# Patient Record
Sex: Female | Born: 1990
Health system: Southern US, Community
[De-identification: ages and names within clinical notes are randomized; demographics above are authoritative.]

## PROBLEM LIST (undated history)

## (undated) ENCOUNTER — Inpatient Hospital Stay (HOSPITAL_COMMUNITY): Payer: Self-pay

## (undated) DIAGNOSIS — K219 Gastro-esophageal reflux disease without esophagitis: Secondary | ICD-10-CM

## (undated) DIAGNOSIS — J45909 Unspecified asthma, uncomplicated: Secondary | ICD-10-CM

## (undated) DIAGNOSIS — K602 Anal fissure, unspecified: Secondary | ICD-10-CM

## (undated) DIAGNOSIS — A549 Gonococcal infection, unspecified: Secondary | ICD-10-CM

## (undated) DIAGNOSIS — B999 Unspecified infectious disease: Secondary | ICD-10-CM

## (undated) DIAGNOSIS — A749 Chlamydial infection, unspecified: Secondary | ICD-10-CM

## (undated) HISTORY — PX: KNEE SURGERY: SHX244

---
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
## 2011-01-10 ENCOUNTER — Ambulatory Visit (INDEPENDENT_AMBULATORY_CARE_PROVIDER_SITE_OTHER): Payer: Self-pay

## 2011-01-10 ENCOUNTER — Inpatient Hospital Stay (INDEPENDENT_AMBULATORY_CARE_PROVIDER_SITE_OTHER)
Admission: RE | Admit: 2011-01-10 | Discharge: 2011-01-10 | Disposition: A | Discharge status: Home / Self Care | Payer: Self-pay | Source: Ambulatory Visit | Attending: Family Medicine | Admitting: Family Medicine

## 2011-01-10 DIAGNOSIS — S93409A Sprain of unspecified ligament of unspecified ankle, initial encounter: Secondary | ICD-10-CM

## 2011-02-12 NOTE — Op Note (Signed)
Lisa Bond, Lisa Bond                ACCOUNT NO.:  [id number]   MEDICAL RECORD NO.:  [id number]          PATIENT TYPE:  AMB   LOCATION:  [facility]                         FACILITY:  [facility]   PHYSICIAN:  Almedia Balls. Ranell Patrick, M.D. DATE OF BIRTH:  June 26, 1991   DATE OF PROCEDURE:  12/21/2005  DATE OF DISCHARGE:                                 OPERATIVE REPORT   PREOPERATIVE DIAGNOSIS:  Left knee foreign body.   POSTOPERATIVE DIAGNOSIS:  Left knee foreign body.   OPERATION PERFORMED:  Excision of foreign body, left prepatellar area  (knee).   SURGEON:  Almedia Balls. Ranell Patrick, M.D.   ASSISTANT:  None.   ANESTHESIA:  Local plus block.   ESTIMATED BLOOD LOSS:  Minimal.   TOURNIQUET TIME:  12 minutes.   INSTRUMENT COUNT:  Correct.   COMPLICATIONS:  None.  Perioperative antibiotics were given.   INDICATIONS FOR PROCEDURE:  The patient is a 20 year old female with a  history of questionable foreign body in the left prepatellar area.  She has  a very tender spot there and has actually had it break open and drain a  couple of times.  X-rays negative. Patient presents now for presumptive  foreign body excision.   DESCRIPTION OF PROCEDURE:  After adequate level of anesthesia achieved,  patient positioned supine on the operating table.  Nonsterile tourniquet was  placed on the left proximal thigh.  The left leg was sterilely prepped and  draped in the usual manner.  We exsanguinated the limb using Esmarch  bandage, elevated the tourniquet to 275 mmHg.  A football shaped skin  incision was created around the premarked area on the prepatellar left knee.  Dissected down through the subcutaneous tissues all the way down to the  bursal area in the capsule and took this out as a single full thickness  excisional biopsy and sent this to pathology.  Went ahead and thoroughly  irrigated the wound.  Bovie electrocautery for hemostasis, then closed with  a layered closure of Vicryl, Monocryl, Steri-Strips applied  followed by  sterile dressing.  The patient was taken to the recovery room having  tolerated surgery well.           ______________________________  Almedia Balls. Ranell Patrick, M.D.    SRN/MEDQ  D:  12/21/2005  T:  12/23/2005  Job:  [id number]

## 2011-04-29 ENCOUNTER — Emergency Department (HOSPITAL_COMMUNITY)
Admission: EM | Admit: 2011-04-29 | Discharge: 2011-04-29 | Disposition: A | Discharge status: Home / Self Care | Payer: Self-pay | Attending: Emergency Medicine | Admitting: Emergency Medicine

## 2011-04-29 DIAGNOSIS — N898 Other specified noninflammatory disorders of vagina: Secondary | ICD-10-CM | POA: Insufficient documentation

## 2011-04-29 DIAGNOSIS — N946 Dysmenorrhea, unspecified: Secondary | ICD-10-CM | POA: Insufficient documentation

## 2011-04-29 DIAGNOSIS — N949 Unspecified condition associated with female genital organs and menstrual cycle: Secondary | ICD-10-CM | POA: Insufficient documentation

## 2011-04-29 DIAGNOSIS — R109 Unspecified abdominal pain: Secondary | ICD-10-CM | POA: Insufficient documentation

## 2011-04-29 DIAGNOSIS — R3915 Urgency of urination: Secondary | ICD-10-CM | POA: Insufficient documentation

## 2011-04-29 DIAGNOSIS — N938 Other specified abnormal uterine and vaginal bleeding: Secondary | ICD-10-CM | POA: Insufficient documentation

## 2011-04-29 DIAGNOSIS — R110 Nausea: Secondary | ICD-10-CM | POA: Insufficient documentation

## 2011-04-29 LAB — WET PREP, GENITAL
Clue Cells Wet Prep HPF POC: NONE SEEN
Trich, Wet Prep: NONE SEEN
Yeast Wet Prep HPF POC: NONE SEEN

## 2011-04-29 LAB — URINALYSIS, ROUTINE W REFLEX MICROSCOPIC
Glucose, UA: NEGATIVE mg/dL
Ketones, ur: 15 mg/dL — AB
Nitrite: NEGATIVE
Protein, ur: 30 mg/dL — AB
Specific Gravity, Urine: 1.029 (ref 1.005–1.030)
Urobilinogen, UA: 1 mg/dL (ref 0.0–1.0)
pH: 6.5 (ref 5.0–8.0)

## 2011-04-29 LAB — POCT PREGNANCY, URINE: Preg Test, Ur: NEGATIVE

## 2011-04-29 LAB — URINE MICROSCOPIC-ADD ON

## 2011-05-03 NOTE — ED Notes (Signed)
Prescriptions called in for cipro 500mg  po x1 dose and azithromycin one gm po x1 dose at [facility] on [address], no refills; instructed pt in safe sex, informing all partners, and no sex until 10 days after last partner treated

## 2011-07-09 LAB — POCT URINALYSIS DIP (DEVICE)
Hgb urine dipstick: NEGATIVE
Ketones, ur: NEGATIVE
Protein, ur: NEGATIVE
Specific Gravity, Urine: 1.015
pH: 6.5

## 2011-07-13 LAB — ANA: Anti Nuclear Antibody(ANA): NEGATIVE

## 2011-07-13 LAB — RHEUMATOID FACTOR: Rhuematoid fact SerPl-aCnc: <20

## 2011-07-13 LAB — SEDIMENTATION RATE: Sed Rate: 3

## 2012-04-18 ENCOUNTER — Emergency Department (HOSPITAL_COMMUNITY)
Admission: EM | Admit: 2012-04-18 | Discharge: 2012-04-18 | Disposition: A | Discharge status: Home / Self Care | Payer: Self-pay | Attending: Emergency Medicine | Admitting: Emergency Medicine

## 2012-04-18 ENCOUNTER — Encounter (HOSPITAL_COMMUNITY): Payer: Self-pay | Admitting: *Deleted

## 2012-04-18 DIAGNOSIS — N760 Acute vaginitis: Secondary | ICD-10-CM | POA: Insufficient documentation

## 2012-04-18 DIAGNOSIS — F17200 Nicotine dependence, unspecified, uncomplicated: Secondary | ICD-10-CM | POA: Insufficient documentation

## 2012-04-18 DIAGNOSIS — R109 Unspecified abdominal pain: Secondary | ICD-10-CM

## 2012-04-18 DIAGNOSIS — A499 Bacterial infection, unspecified: Secondary | ICD-10-CM | POA: Insufficient documentation

## 2012-04-18 DIAGNOSIS — B9689 Other specified bacterial agents as the cause of diseases classified elsewhere: Secondary | ICD-10-CM | POA: Insufficient documentation

## 2012-04-18 LAB — POCT I-STAT, CHEM 8
Calcium, Ion: 1.19 mmol/L (ref 1.12–1.23)
Creatinine, Ser: 0.8 mg/dL (ref 0.50–1.10)
Hemoglobin: 14.3 g/dL (ref 12.0–15.0)
Sodium: 141 mEq/L (ref 135–145)
TCO2: 23 mmol/L (ref 0–100)

## 2012-04-18 LAB — URINALYSIS, ROUTINE W REFLEX MICROSCOPIC
Ketones, ur: NEGATIVE mg/dL
Protein, ur: NEGATIVE mg/dL
Urobilinogen, UA: 1 mg/dL (ref 0.0–1.0)

## 2012-04-18 LAB — WET PREP, GENITAL
Trich, Wet Prep: NONE SEEN
Yeast Wet Prep HPF POC: NONE SEEN

## 2012-04-18 LAB — URINE MICROSCOPIC-ADD ON

## 2012-04-18 MED ORDER — METRONIDAZOLE 500 MG PO TABS: 500.0000 mg | ORAL_TABLET | Freq: Two times a day (BID) | ORAL | Status: AC

## 2012-04-18 NOTE — ED Notes (Signed)
Pt is here with fatigue.  Pt is here with lower abdominal pain and back pain for the last 2weeks.  PT has missed menstral this month.  Pt thinks 2-3 weeks late..  No vaginal discharge or bleeding and reports increased urination.  Pt reports thirst

## 2012-04-18 NOTE — ED Provider Notes (Signed)
History     CSN: [id number]  Arrival date & time 04/18/12  1041   First MD Initiated Contact with Patient 04/18/12 1159      Chief Complaint  Patient presents with  . Abdominal Pain    (Consider location/radiation/quality/duration/timing/severity/associated sxs/prior treatment) HPI Comments: Patient reports that she has had lower abdominal pain intermittently over the past 2 weeks.  Pain predominantly in the suprapubic area.  She has not taken anything for pain.  She describes the pain as a cramping type pain.  She also reports that her menstrual cycle is approximately 2-3 weeks late.  She normally has regular periods.  She is currently sexually active.  She denies any vaginal discharge or vaginal bleeding.    Patient is a 21 y.o. female presenting with abdominal pain. The history is provided by the patient.  Abdominal Pain The primary symptoms of the illness include abdominal pain. The primary symptoms of the illness do not include fever, shortness of breath, nausea, vomiting, diarrhea, dysuria, vaginal discharge or vaginal bleeding.  The patient states that she believes she is currently not pregnant. The patient has not had a change in bowel habit. Symptoms associated with the illness do not include chills, constipation, urgency, hematuria or frequency.    History reviewed. No pertinent past medical history.  Past Surgical History  Procedure Date  . Knee surgery     left    No family history on file.  History  Substance Use Topics  . Smoking status: Current Everyday Smoker  . Smokeless tobacco: Not on file  . Alcohol Use: No    OB History    Grav Para Term Preterm Abortions TAB SAB Ect Mult Living                  Review of Systems  Constitutional: Negative for fever and chills.  Respiratory: Negative for shortness of breath.   Cardiovascular: Negative for chest pain.  Gastrointestinal: Positive for abdominal pain. Negative for nausea, vomiting, diarrhea,  constipation, blood in stool and abdominal distention.  Genitourinary: Positive for pelvic pain. Negative for dysuria, urgency, frequency, hematuria, vaginal bleeding, vaginal discharge, genital sores and vaginal pain.  Neurological: Negative for dizziness, syncope and light-headedness.    Allergies  Amoxicillin  Home Medications   Current Outpatient Rx  Name Route Sig Dispense Refill  . METRONIDAZOLE 500 MG PO TABS Oral Take 1 tablet (500 mg total) by mouth 2 (two) times daily. 14 tablet 0    BP 112/77  Pulse 78  Temp 98.2 F (36.8 C) (Oral)  Resp 18  SpO2 98%  LMP 03/11/2012  Physical Exam  Nursing note and vitals reviewed. Constitutional: She appears well-developed and well-nourished. No distress.  HENT:  Head: Normocephalic and atraumatic.  Mouth/Throat: Oropharynx is clear and moist.  Neck: Normal range of motion. Neck supple.  Cardiovascular: Normal rate, regular rhythm and normal heart sounds.   Pulmonary/Chest: Effort normal and breath sounds normal.  Abdominal: Soft. Bowel sounds are normal. She exhibits no distension and no mass. There is tenderness in the suprapubic area. There is no rebound and no guarding.       Mild suprapubic abdominal pain. No RLQ or LLQ pain.  Genitourinary: Uterus normal. There is no rash, tenderness or lesion on the right labia. There is no rash, tenderness or lesion on the left labia. Cervix exhibits no motion tenderness. Right adnexum displays no mass, no tenderness and no fullness. Left adnexum displays no mass, no tenderness and no fullness. No erythema,  tenderness or bleeding around the vagina. Vaginal discharge found.       Thick white discharge in the vaginal vault  Neurological: She is alert.  Skin: Skin is warm and dry. No rash noted. She is not diaphoretic.  Psychiatric: She has a normal mood and affect.    ED Course  Procedures (including critical care time)  Labs Reviewed  URINALYSIS, ROUTINE W REFLEX MICROSCOPIC -  Abnormal; Notable for the following:    Leukocytes, UA TRACE (*)     All other components within normal limits  POCT I-STAT, CHEM 8 - Abnormal; Notable for the following:    Glucose, Bld 105 (*)     All other components within normal limits  URINE MICROSCOPIC-ADD ON - Abnormal; Notable for the following:    Squamous Epithelial / LPF FEW (*)     Bacteria, UA FEW (*)     All other components within normal limits  WET PREP, GENITAL - Abnormal; Notable for the following:    Clue Cells Wet Prep HPF POC FEW (*)     WBC, Wet Prep HPF POC MODERATE (*)     All other components within normal limits  POCT PREGNANCY, URINE  GC/CHLAMYDIA PROBE AMP, GENITAL   No results found.   1. Abdominal pain   2. Bacterial vaginosis       MDM  Patient here with intermittent lower abdominal pain that has been present for the past 2 weeks.  Urine pregnancy negative.  No nausea, vomiting, or diarrhea.  No vaginal bleeding.  Patient afebrile.  Labs unremarkable.  No rebound or guarding on exam.  Therefore, do not feel that any imaging is indicated at this time.          Pascal Lux [employer], PA-C 04/19/12 [phone]

## 2012-04-19 LAB — GC/CHLAMYDIA PROBE AMP, GENITAL: GC Probe Amp, Genital: NEGATIVE

## 2012-04-19 NOTE — ED Provider Notes (Signed)
Medical screening examination/treatment/procedure(s) were performed by non-physician practitioner and as supervising physician I was immediately available for consultation/collaboration.  Cheri Guppy, MD 04/19/12 [phone]

## 2012-08-31 ENCOUNTER — Emergency Department (HOSPITAL_COMMUNITY)
Admission: EM | Admit: 2012-08-31 | Discharge: 2012-08-31 | Disposition: A | Discharge status: Home / Self Care | Payer: Self-pay | Attending: Emergency Medicine | Admitting: Emergency Medicine

## 2012-08-31 ENCOUNTER — Encounter (HOSPITAL_COMMUNITY): Payer: Self-pay | Admitting: Emergency Medicine

## 2012-08-31 DIAGNOSIS — F17200 Nicotine dependence, unspecified, uncomplicated: Secondary | ICD-10-CM | POA: Insufficient documentation

## 2012-08-31 DIAGNOSIS — R102 Pelvic and perineal pain: Secondary | ICD-10-CM

## 2012-08-31 DIAGNOSIS — R109 Unspecified abdominal pain: Secondary | ICD-10-CM | POA: Insufficient documentation

## 2012-08-31 DIAGNOSIS — N946 Dysmenorrhea, unspecified: Secondary | ICD-10-CM | POA: Insufficient documentation

## 2012-08-31 DIAGNOSIS — R3915 Urgency of urination: Secondary | ICD-10-CM | POA: Insufficient documentation

## 2012-08-31 DIAGNOSIS — R110 Nausea: Secondary | ICD-10-CM | POA: Insufficient documentation

## 2012-08-31 DIAGNOSIS — R350 Frequency of micturition: Secondary | ICD-10-CM | POA: Insufficient documentation

## 2012-08-31 DIAGNOSIS — Z3202 Encounter for pregnancy test, result negative: Secondary | ICD-10-CM | POA: Insufficient documentation

## 2012-08-31 DIAGNOSIS — N949 Unspecified condition associated with female genital organs and menstrual cycle: Secondary | ICD-10-CM | POA: Insufficient documentation

## 2012-08-31 DIAGNOSIS — M549 Dorsalgia, unspecified: Secondary | ICD-10-CM | POA: Insufficient documentation

## 2012-08-31 LAB — URINALYSIS, ROUTINE W REFLEX MICROSCOPIC
Bilirubin Urine: NEGATIVE
Glucose, UA: NEGATIVE mg/dL
Ketones, ur: NEGATIVE mg/dL
Protein, ur: NEGATIVE mg/dL
Urobilinogen, UA: 1 mg/dL (ref 0.0–1.0)

## 2012-08-31 LAB — URINE MICROSCOPIC-ADD ON

## 2012-08-31 LAB — POCT PREGNANCY, URINE: Preg Test, Ur: NEGATIVE

## 2012-08-31 MED ORDER — ONDANSETRON 4 MG PO TBDP
4.0000 mg | ORAL_TABLET | Freq: Once | ORAL | Status: AC
  Administered 2012-08-31: 4 mg via ORAL
  Filled 2012-08-31: qty 1

## 2012-08-31 MED ORDER — OXYCODONE-ACETAMINOPHEN 5-325 MG PO TABS
1.0000 | ORAL_TABLET | Freq: Once | ORAL | Status: AC
  Administered 2012-08-31: 1 via ORAL

## 2012-08-31 MED ORDER — OXYCODONE-ACETAMINOPHEN 5-325 MG PO TABS
1.0000 | ORAL_TABLET | Freq: Once | ORAL | Status: DC
  Filled 2012-08-31 (×2): qty 1

## 2012-08-31 MED ORDER — CEFTRIAXONE SODIUM 250 MG IJ SOLR
250.0000 mg | Freq: Once | INTRAMUSCULAR | Status: AC
  Administered 2012-08-31: 250 mg via INTRAMUSCULAR
  Filled 2012-08-31: qty 250

## 2012-08-31 MED ORDER — AZITHROMYCIN 250 MG PO TABS
1000.0000 mg | ORAL_TABLET | Freq: Once | ORAL | Status: AC
  Administered 2012-08-31: 1000 mg via ORAL
  Filled 2012-08-31: qty 3
  Filled 2012-08-31: qty 4

## 2012-08-31 NOTE — ED Notes (Signed)
Pt c/o generalized abd pain and lower back pain with pain and frequency with urination x several days

## 2012-08-31 NOTE — ED Provider Notes (Addendum)
History     CSN: [id number]  Arrival date & time 08/31/12  1147   First MD Initiated Contact with Patient 08/31/12 1613      Chief Complaint  Patient presents with  . Dysuria  . Abdominal Pain  . Back Pain    (Consider location/radiation/quality/duration/timing/severity/associated sxs/prior treatment) Patient is a 21 y.o. female presenting with dysuria, abdominal pain, and back pain. The history is provided by the patient.  Dysuria  This is a new problem. Episode onset: 2 weeks. The problem occurs every urination. The problem has been gradually worsening. The quality of the pain is described as burning. The pain is at a severity of 7/10. The pain is moderate. There has been no fever. She is sexually active. There is no history of pyelonephritis. Associated symptoms include nausea, frequency, urgency and flank pain. Pertinent negatives include no vomiting, no discharge, no hematuria and no possible pregnancy. She has tried nothing for the symptoms. Her past medical history does not include kidney stones, recurrent UTIs or catheterization.  Abdominal Pain The primary symptoms of the illness include abdominal pain, nausea and dysuria. The primary symptoms of the illness do not include shortness of breath, vomiting, diarrhea, vaginal discharge or vaginal bleeding. The current episode started 6 to 12 hours ago. The onset of the illness was gradual. The problem has not changed since onset. The dysuria is associated with frequency and urgency. The dysuria is not associated with discharge or hematuria.  Additional symptoms associated with the illness include urgency, frequency and back pain. Symptoms associated with the illness do not include hematuria.  Back Pain  Associated symptoms include abdominal pain and dysuria.    History reviewed. No pertinent past medical history.  Past Surgical History  Procedure Date  . Knee surgery     left    History reviewed. No pertinent family  history.  History  Substance Use Topics  . Smoking status: Current Every Day Smoker  . Smokeless tobacco: Not on file  . Alcohol Use: No    OB History    Grav Para Term Preterm Abortions TAB SAB Ect Mult Living                  Review of Systems  Respiratory: Negative for shortness of breath.   Gastrointestinal: Positive for nausea and abdominal pain. Negative for vomiting and diarrhea.  Genitourinary: Positive for dysuria, urgency, frequency and flank pain. Negative for hematuria, vaginal bleeding and vaginal discharge.  Musculoskeletal: Positive for back pain.    Allergies  Amoxicillin and Penicillins  Home Medications  No current outpatient prescriptions on file.  BP 127/81  Pulse 90  Temp 98.3 F (36.8 C) (Oral)  Resp 16  SpO2 98%  Physical Exam  Nursing note and vitals reviewed. Constitutional: She is oriented to person, place, and time. She appears well-developed and well-nourished. No distress.  HENT:  Head: Normocephalic and atraumatic.  Mouth/Throat: Oropharynx is clear and moist.  Eyes: Conjunctivae normal and EOM are normal. Pupils are equal, round, and reactive to light.  Neck: Normal range of motion. Neck supple.  Cardiovascular: Normal rate, regular rhythm and intact distal pulses.   No murmur heard. Pulmonary/Chest: Effort normal and breath sounds normal. No respiratory distress. She has no wheezes. She has no rales.  Abdominal: Soft. Normal appearance. She exhibits no distension. There is tenderness in the suprapubic area. There is no rebound and no guarding.       Lower lumbar tenderness.  No true CVA tenderness  Genitourinary:  Uterus is tender. Cervix exhibits no motion tenderness and no discharge. Right adnexum displays tenderness. Right adnexum displays no mass and no fullness. Left adnexum displays tenderness. Left adnexum displays no mass and no fullness. There is bleeding around the vagina.  Musculoskeletal: Normal range of motion. She exhibits  no edema and no tenderness.  Neurological: She is alert and oriented to person, place, and time.  Skin: Skin is warm and dry. No rash noted. No erythema.  Psychiatric: She has a normal mood and affect. Her behavior is normal.    ED Course  Procedures (including critical care time)  Labs Reviewed  URINALYSIS, ROUTINE W REFLEX MICROSCOPIC - Abnormal; Notable for the following:    APPearance HAZY (*)     Hgb urine dipstick LARGE (*)     Leukocytes, UA TRACE (*)     All other components within normal limits  URINE MICROSCOPIC-ADD ON - Abnormal; Notable for the following:    Squamous Epithelial / LPF MANY (*)     Bacteria, UA FEW (*)     All other components within normal limits  WET PREP, GENITAL - Abnormal; Notable for the following:    Yeast Wet Prep HPF POC FEW (*)     WBC, Wet Prep HPF POC FEW (*)     All other components within normal limits  POCT PREGNANCY, URINE  GC/CHLAMYDIA PROBE AMP   No results found.   1. Pelvic pain   2. Dysmenorrhea       MDM   Patient with symptoms most consistent with pyelonephritis. She states she's had urinary frequency and dysuria for 2 weeks but today woke up with back pain, lower abdominal pain and nausea. No vomiting at this time. She has suprapubic tenderness and bilateral lower lumbar tenderness. Symptoms are not suggestive of a kidney stone at this point. She denies any vaginal symptoms however will do a pelvic exam to ensure no signs concerning for PID. However she states she is sexually active with one partner uses protection. Urine pregnancy test is negative.  On pelvic exam patient is having vaginal bleeding which is most likely why there is blood in her urine a sample was contaminated. She also has uterine tenderness and bilateral adnexal tenderness without discharge and no cervical motion tenderness consistent with PID. However feel that patient's symptoms were vaginal the likelihood of pyelonephritis is much less. Patient was treated  with Rocephin and they sent her in she states she had gonorrhea and Chlamydia approximately one year ago.  5:20 PM Wet prep with few yeast and WBC's.  Treated with rocephin and azithro.  Discussed using monistat for possible early yeast infection.  No glucose in urine and normal blood sugar.    Lisa Sprout, MD 08/31/12 1721  Lisa Sprout, MD 08/31/12 1726

## 2012-09-01 LAB — GC/CHLAMYDIA PROBE AMP
CT Probe RNA: NEGATIVE
GC Probe RNA: NEGATIVE

## 2013-01-19 ENCOUNTER — Inpatient Hospital Stay (HOSPITAL_COMMUNITY)
Admission: AD | Admit: 2013-01-19 | Discharge: 2013-01-20 | Disposition: A | Discharge status: Home / Self Care | Payer: Self-pay | Source: Ambulatory Visit | Attending: Obstetrics | Admitting: Obstetrics

## 2013-01-19 ENCOUNTER — Encounter (HOSPITAL_COMMUNITY): Payer: Self-pay | Admitting: *Deleted

## 2013-01-19 DIAGNOSIS — R109 Unspecified abdominal pain: Secondary | ICD-10-CM | POA: Insufficient documentation

## 2013-01-19 DIAGNOSIS — N949 Unspecified condition associated with female genital organs and menstrual cycle: Secondary | ICD-10-CM | POA: Insufficient documentation

## 2013-01-19 DIAGNOSIS — R102 Pelvic and perineal pain: Secondary | ICD-10-CM

## 2013-01-19 LAB — URINE MICROSCOPIC-ADD ON

## 2013-01-19 LAB — URINALYSIS, ROUTINE W REFLEX MICROSCOPIC
Bilirubin Urine: NEGATIVE
Hgb urine dipstick: NEGATIVE
Specific Gravity, Urine: 1.015 (ref 1.005–1.030)
pH: 8 (ref 5.0–8.0)

## 2013-01-19 NOTE — MAU Note (Signed)
Pt states she has back pain and abd pain and is urinating a lot-sttes she may hav ea bladder infetction

## 2013-01-19 NOTE — MAU Provider Note (Signed)
History     CSN: [id number]  Arrival date and time: 01/19/13 2256   First Provider Initiated Contact with Patient 01/19/13 2340      Chief Complaint  Patient presents with  . Abdominal Pain   HPI  Lisa Bond is a 22 y.o. G0P0 who is here today with lower abdominal pain and "it feels like something is wrong down there". She denies any vaginal discharge. She was treated with rocephin in December for this same pain. The GC/CT was negative, but she states that the pain resolved after that. She denies any vaginal bleeding or vaginal discharge. No itching odor or irritation.   History reviewed. No pertinent past medical history.  Past Surgical History  Procedure Laterality Date  . Knee surgery      left  . No past surgeries      History reviewed. No pertinent family history.  History  Substance Use Topics  . Smoking status: Current Every Day Smoker  . Smokeless tobacco: Never Used  . Alcohol Use: No    Allergies:  Allergies  Allergen Reactions  . Amoxicillin Hives  . Penicillins Hives    No prescriptions prior to admission    Review of Systems  Constitutional: Negative for fever and chills.  Eyes: Negative for blurred vision.  Gastrointestinal: Positive for abdominal pain (lower abdomen). Negative for nausea, vomiting, diarrhea and constipation.  Genitourinary: Positive for frequency. Negative for dysuria and urgency.  Musculoskeletal: Negative for myalgias.  Neurological: Negative for headaches.   Physical Exam   Blood pressure 119/70, pulse 89, temperature 98.1 F (36.7 C), temperature source Oral, resp. rate 20, height 5\' 1"  (1.549 m), weight 89.359 kg (197 lb), last menstrual period 12/30/2012, SpO2 98.00%.  Physical Exam  Nursing note and vitals reviewed. Constitutional: She is oriented to person, place, and time. She appears well-developed and well-nourished. No distress.  Cardiovascular: Normal rate.   Respiratory: Effort normal.  GI: Soft.   Genitourinary:   External: no lesion Vagina: scant amount of white discharge Cervix: pink, smooth, no CMT Uterus: NT Adnexa: NT  Neurological: She is alert and oriented to person, place, and time.  Skin: Skin is warm and dry.  Psychiatric: She has a normal mood and affect.    MAU Course  Procedures  Results for orders placed during the hospital encounter of 01/19/13 (from the past 24 hour(s))  URINALYSIS, ROUTINE W REFLEX MICROSCOPIC     Status: Abnormal   Collection Time    01/19/13 11:10 PM      Result Value Range   Color, Urine YELLOW  YELLOW   APPearance HAZY (*) CLEAR   Specific Gravity, Urine 1.015  1.005 - 1.030   pH 8.0  5.0 - 8.0   Glucose, UA NEGATIVE  NEGATIVE mg/dL   Hgb urine dipstick NEGATIVE  NEGATIVE   Bilirubin Urine NEGATIVE  NEGATIVE   Ketones, ur NEGATIVE  NEGATIVE mg/dL   Protein, ur NEGATIVE  NEGATIVE mg/dL   Urobilinogen, UA 0.2  0.0 - 1.0 mg/dL   Nitrite NEGATIVE  NEGATIVE   Leukocytes, UA TRACE (*) NEGATIVE  URINE MICROSCOPIC-ADD ON     Status: Abnormal   Collection Time    01/19/13 11:10 PM      Result Value Range   Squamous Epithelial / LPF FEW (*) RARE   WBC, UA 0-2  <3 WBC/hpf   RBC / HPF 0-2  <3 RBC/hpf   Bacteria, UA RARE  RARE   Urine-Other YEAST    POCT PREGNANCY, URINE  Status: None   Collection Time    01/19/13 11:15 PM      Result Value Range   Preg Test, Ur NEGATIVE  NEGATIVE  WET PREP, GENITAL     Status: Abnormal   Collection Time    01/19/13 11:49 PM      Result Value Range   Yeast Wet Prep HPF POC NONE SEEN  NONE SEEN   Trich, Wet Prep NONE SEEN  NONE SEEN   Clue Cells Wet Prep HPF POC FEW (*) NONE SEEN   WBC, Wet Prep HPF POC FEW (*) NONE SEEN     Assessment and Plan   1. Pelvic pain    Urine Culture pending FU with primary OBGYN as needed  Tawnya Crook 01/19/2013, 11:42 PM

## 2013-01-20 DIAGNOSIS — N949 Unspecified condition associated with female genital organs and menstrual cycle: Secondary | ICD-10-CM

## 2013-01-20 DIAGNOSIS — R109 Unspecified abdominal pain: Secondary | ICD-10-CM

## 2013-01-20 LAB — WET PREP, GENITAL
Trich, Wet Prep: NONE SEEN
Yeast Wet Prep HPF POC: NONE SEEN

## 2013-01-20 LAB — GC/CHLAMYDIA PROBE AMP: CT Probe RNA: NEGATIVE

## 2013-01-21 LAB — URINE CULTURE: Colony Count: >=100000

## 2013-04-02 ENCOUNTER — Inpatient Hospital Stay (HOSPITAL_COMMUNITY)
Admission: AD | Admit: 2013-04-02 | Discharge: 2013-04-02 | Disposition: A | Discharge status: Home / Self Care | Payer: Self-pay | Source: Ambulatory Visit | Attending: Obstetrics & Gynecology | Admitting: Obstetrics & Gynecology

## 2013-04-02 ENCOUNTER — Encounter (HOSPITAL_COMMUNITY): Payer: Self-pay | Admitting: *Deleted

## 2013-04-02 DIAGNOSIS — R109 Unspecified abdominal pain: Secondary | ICD-10-CM | POA: Insufficient documentation

## 2013-04-02 DIAGNOSIS — N898 Other specified noninflammatory disorders of vagina: Secondary | ICD-10-CM

## 2013-04-02 DIAGNOSIS — N949 Unspecified condition associated with female genital organs and menstrual cycle: Secondary | ICD-10-CM | POA: Insufficient documentation

## 2013-04-02 HISTORY — DX: Unspecified infectious disease: B99.9

## 2013-04-02 LAB — WET PREP, GENITAL
Trich, Wet Prep: NONE SEEN
Yeast Wet Prep HPF POC: NONE SEEN

## 2013-04-02 LAB — URINALYSIS, ROUTINE W REFLEX MICROSCOPIC
Bilirubin Urine: NEGATIVE
Glucose, UA: NEGATIVE mg/dL
Specific Gravity, Urine: 1.02 (ref 1.005–1.030)
Urobilinogen, UA: 0.2 mg/dL (ref 0.0–1.0)

## 2013-04-02 LAB — URINE MICROSCOPIC-ADD ON

## 2013-04-02 NOTE — MAU Note (Signed)
Been having cramping for past wk.  First noted d/c about a wk ago, never had anything like this before.  Missed period, has not done test.

## 2013-04-02 NOTE — Progress Notes (Signed)
Isaias Sakai CNM at bedside, collecting wet prep, pelvic exam. Awaiting results

## 2013-04-03 LAB — GC/CHLAMYDIA PROBE AMP: CT Probe RNA: NEGATIVE

## 2013-07-05 ENCOUNTER — Emergency Department (HOSPITAL_COMMUNITY)
Admission: EM | Admit: 2013-07-05 | Discharge: 2013-07-05 | Disposition: A | Discharge status: Home / Self Care | Payer: Self-pay | Attending: Emergency Medicine | Admitting: Emergency Medicine

## 2013-07-05 ENCOUNTER — Emergency Department (HOSPITAL_COMMUNITY): Payer: Self-pay

## 2013-07-05 ENCOUNTER — Encounter (HOSPITAL_COMMUNITY): Payer: Self-pay | Admitting: Emergency Medicine

## 2013-07-05 DIAGNOSIS — Y9383 Activity, rough housing and horseplay: Secondary | ICD-10-CM | POA: Insufficient documentation

## 2013-07-05 DIAGNOSIS — S8990XA Unspecified injury of unspecified lower leg, initial encounter: Secondary | ICD-10-CM | POA: Insufficient documentation

## 2013-07-05 DIAGNOSIS — W010XXA Fall on same level from slipping, tripping and stumbling without subsequent striking against object, initial encounter: Secondary | ICD-10-CM | POA: Insufficient documentation

## 2013-07-05 DIAGNOSIS — Y990 Civilian activity done for income or pay: Secondary | ICD-10-CM | POA: Insufficient documentation

## 2013-07-05 DIAGNOSIS — M25561 Pain in right knee: Secondary | ICD-10-CM

## 2013-07-05 DIAGNOSIS — Z8619 Personal history of other infectious and parasitic diseases: Secondary | ICD-10-CM | POA: Insufficient documentation

## 2013-07-05 DIAGNOSIS — Y9269 Other specified industrial and construction area as the place of occurrence of the external cause: Secondary | ICD-10-CM | POA: Insufficient documentation

## 2013-07-05 DIAGNOSIS — Z87891 Personal history of nicotine dependence: Secondary | ICD-10-CM | POA: Insufficient documentation

## 2013-07-05 MED ORDER — IBUPROFEN 800 MG PO TABS: 800.0000 mg | ORAL_TABLET | Freq: Three times a day (TID) | ORAL | Status: DC

## 2013-07-05 MED ORDER — IBUPROFEN 800 MG PO TABS
800.0000 mg | ORAL_TABLET | Freq: Once | ORAL | Status: AC
  Administered 2013-07-05: 800 mg via ORAL
  Filled 2013-07-05: qty 1

## 2013-07-05 NOTE — ED Notes (Signed)
Xray called to verify need for urine preg. Pt states "just finished her period, uses condoms, and no chance of being pregnant." EDP notified and stated xray tech to shield pt's reproductive organs; abd region not being xrayed.

## 2013-07-05 NOTE — ED Notes (Addendum)
Per pt, was playing around at work last night around 2000. Pt reports slipping on water while in a squatting position. Pt states right knee outward with fall. Pt states she is able to walk but it severely hurts when moved from side to side. Pt denies worker's comp.

## 2013-07-05 NOTE — Progress Notes (Signed)
[facility] provided pt with a list of primary care resources.  °

## 2013-07-05 NOTE — ED Provider Notes (Signed)
CSN: [id number]     Arrival date & time 07/05/13  0906 History   First MD Initiated Contact with Patient 07/05/13 0914     Chief Complaint  Patient presents with  . Knee Injury   (Consider location/radiation/quality/duration/timing/severity/associated sxs/prior Treatment) Patient is a 22 y.o. female presenting with knee pain. The history is provided by the patient.  Knee Pain Location:  Knee Injury: yes   Mechanism of injury: fall   Fall:    Fall occurred:  Recreating/playing (while playing around, was squating and then fell on her R knee as her hip abducted and knee flexed varus)   Impact surface:  Concrete   Point of impact: R knee. Knee location:  R knee Pain details:    Quality:  Aching   Radiates to:  Does not radiate   Severity:  Moderate   Onset quality:  Sudden   Timing:  Constant   Progression:  Unchanged Chronicity:  New Dislocation: no   Relieved by:  Rest Worsened by:  Bearing weight Ineffective treatments:  None tried Associated symptoms: no back pain, no fever and no neck pain     Past Medical History  Diagnosis Date  . Infection 2012    GC, chlamydia   Past Surgical History  Procedure Laterality Date  . Knee surgery      left   Family History  Problem Relation Age of Onset  . Heart disease Father    History  Substance Use Topics  . Smoking status: Former Smoker -- 0.25 packs/day for 2 years    Types: Cigarettes  . Smokeless tobacco: Never Used  . Alcohol Use: No   OB History   Grav Para Term Preterm Abortions TAB SAB Ect Mult Living   0              Review of Systems  Constitutional: Negative for fever.  Respiratory: Negative for cough and shortness of breath.   Gastrointestinal: Negative for vomiting and abdominal pain.  Musculoskeletal: Negative for back pain and neck pain.  All other systems reviewed and are negative.    Allergies  Amoxicillin and Penicillins  Home Medications   Current Outpatient Rx  Name  Route  Sig   Dispense  Refill  . ibuprofen (ADVIL,MOTRIN) 800 MG tablet   Oral   Take 1 tablet (800 mg total) by mouth 3 (three) times daily.   21 tablet   0    BP 133/79  Pulse 71  Temp(Src) 97.9 F (36.6 C) (Oral)  Resp 16  SpO2 98%  LMP 06/24/2013 Physical Exam  Nursing note and vitals reviewed. Constitutional: She is oriented to person, place, and time. She appears well-developed and well-nourished. No distress.  HENT:  Head: Normocephalic and atraumatic.  Eyes: EOM are normal. Pupils are equal, round, and reactive to light.  Neck: Normal range of motion. Neck supple.  Cardiovascular: Normal rate and regular rhythm.  Exam reveals no friction rub.   No murmur heard. Pulmonary/Chest: Effort normal and breath sounds normal. No respiratory distress. She has no wheezes. She has no rales.  Abdominal: Soft. She exhibits no distension. There is no tenderness. There is no rebound.  Musculoskeletal: Normal range of motion. She exhibits no edema.       Right hip: She exhibits tenderness and bony tenderness. She exhibits no swelling.       Right knee: She exhibits ecchymosis and bony tenderness (medial proximal tibia). She exhibits normal range of motion, no swelling, no deformity, no laceration, no erythema,  normal alignment, no LCL laxity, normal meniscus and no MCL laxity. Tenderness found. Medial joint line tenderness noted. No lateral joint line, no MCL, no LCL and no patellar tendon tenderness noted.       Legs: Neurological: She is alert and oriented to person, place, and time.  Skin: She is not diaphoretic.    ED Course  Procedures (including critical care time) Labs Review Labs Reviewed - No data to display Imaging Review Dg Hip Complete Right  07/05/2013   CLINICAL DATA:  22 year old female status post blunt trauma with pain. Initial encounter.  EXAM: RIGHT HIP - COMPLETE 2+ VIEW  COMPARISON:  None.  FINDINGS: Femoral heads are normally located. Normal hip joint spaces. Bone  mineralization is within normal limits. Pelvis intact. Proximal left femur grossly intact. SI joints within normal limits. Proximal right femur intact. Visualized bowel gas pattern is non obstructed.  IMPRESSION: Negative radiographic appearance of the right hip and pelvis.   Electronically Signed   By: Augusto Gamble M.D.   On: 07/05/2013 10:20   Dg Femur Right  07/05/2013   CLINICAL DATA:  22 year old female status post injury with pain. Initial encounter.  EXAM: RIGHT FEMUR - 2 VIEW  COMPARISON:  Right hip series from the same day reported separately.  FINDINGS: Bone mineralization is within normal limits. Right femur intact. Normal alignment at the right knee. No degenerative findings identified.  IMPRESSION: Normal radiographic appearance of the right femur.   Electronically Signed   By: Augusto Gamble M.D.   On: 07/05/2013 10:22   Dg Knee Complete 4 Views Right  07/05/2013   CLINICAL DATA:  22 year old female status post injury with pain. Initial encounter.  EXAM: RIGHT KNEE - COMPLETE 4+ VIEW  COMPARISON:  Right femur series from the same day reported separately.  FINDINGS: There is no evidence of fracture, dislocation, or joint effusion. There is no evidence of arthropathy or other focal bone abnormality. Soft tissues are unremarkable.  IMPRESSION: Negative.   Electronically Signed   By: Augusto Gamble M.D.   On: 07/05/2013 10:23    MDM   1. Knee pain, acute, right    58F s/p knee injury. Fell on knee while at work and was playing around with a co-worker. Happened last night. Ambulatory since then, however has pain when ambulating. No dislocation, no large shape change of knee. Patient has a bruise on medial proximal tibia with focal tenderness there, no other tenderness. No knee effusion, no laxity. Pain with varus/valgus stress, but no laxity. Pain in thigh without deformity and pain with ROM of hip. Since ambulatory, doubt major injury. Will xray hip, thigh, knee.  Xrays normal, reviewed by me. Will give  ACE wrap, instructed to f/u with PCP if pain continues. Instructed on RICE therapy.  I have reviewed all labs and imaging and considered them in my medical decision making.   Dagmar Hait, MD 07/05/13 1034

## 2013-08-13 ENCOUNTER — Emergency Department (HOSPITAL_COMMUNITY): Payer: Self-pay

## 2013-08-13 ENCOUNTER — Encounter (HOSPITAL_COMMUNITY): Payer: Self-pay | Admitting: Emergency Medicine

## 2013-08-13 ENCOUNTER — Emergency Department (HOSPITAL_COMMUNITY)
Admission: EM | Admit: 2013-08-13 | Discharge: 2013-08-13 | Disposition: A | Discharge status: Home / Self Care | Payer: Self-pay | Attending: Emergency Medicine | Admitting: Emergency Medicine

## 2013-08-13 DIAGNOSIS — J45909 Unspecified asthma, uncomplicated: Secondary | ICD-10-CM | POA: Insufficient documentation

## 2013-08-13 DIAGNOSIS — J069 Acute upper respiratory infection, unspecified: Secondary | ICD-10-CM | POA: Insufficient documentation

## 2013-08-13 DIAGNOSIS — R110 Nausea: Secondary | ICD-10-CM | POA: Insufficient documentation

## 2013-08-13 DIAGNOSIS — Z3202 Encounter for pregnancy test, result negative: Secondary | ICD-10-CM | POA: Insufficient documentation

## 2013-08-13 DIAGNOSIS — Z87891 Personal history of nicotine dependence: Secondary | ICD-10-CM | POA: Insufficient documentation

## 2013-08-13 DIAGNOSIS — Z880 Allergy status to penicillin: Secondary | ICD-10-CM | POA: Insufficient documentation

## 2013-08-13 DIAGNOSIS — N390 Urinary tract infection, site not specified: Secondary | ICD-10-CM | POA: Insufficient documentation

## 2013-08-13 DIAGNOSIS — R042 Hemoptysis: Secondary | ICD-10-CM | POA: Insufficient documentation

## 2013-08-13 DIAGNOSIS — Z8619 Personal history of other infectious and parasitic diseases: Secondary | ICD-10-CM | POA: Insufficient documentation

## 2013-08-13 DIAGNOSIS — R109 Unspecified abdominal pain: Secondary | ICD-10-CM

## 2013-08-13 HISTORY — DX: Unspecified asthma, uncomplicated: J45.909

## 2013-08-13 LAB — CBC WITH DIFFERENTIAL/PLATELET
Basophils Absolute: 0.1 10*3/uL (ref 0.0–0.1)
Eosinophils Relative: 3 % (ref 0–5)
Hemoglobin: 14.1 g/dL (ref 12.0–15.0)
Lymphocytes Relative: 33 % (ref 12–46)
Lymphs Abs: 2.8 10*3/uL (ref 0.7–4.0)
MCH: 29.4 pg (ref 26.0–34.0)
Monocytes Absolute: 0.5 10*3/uL (ref 0.1–1.0)
Neutrophils Relative %: 57 % (ref 43–77)
Platelets: 268 10*3/uL (ref 150–400)
RBC: 4.8 MIL/uL (ref 3.87–5.11)
WBC: 8.6 10*3/uL (ref 4.0–10.5)

## 2013-08-13 LAB — COMPREHENSIVE METABOLIC PANEL
ALT: 19 U/L (ref 0–35)
AST: 18 U/L (ref 0–37)
Albumin: 4.1 g/dL (ref 3.5–5.2)
Alkaline Phosphatase: 87 U/L (ref 39–117)
CO2: 23 mEq/L (ref 19–32)
Calcium: 9.7 mg/dL (ref 8.4–10.5)
Creatinine, Ser: 0.85 mg/dL (ref 0.50–1.10)
GFR calc Af Amer: >90 mL/min (ref >90)
Glucose, Bld: 84 mg/dL (ref 70–99)
Sodium: 135 mEq/L (ref 135–145)
Total Bilirubin: 0.2 mg/dL — ABNORMAL LOW (ref 0.3–1.2)
Total Protein: 8.3 g/dL (ref 6.0–8.3)

## 2013-08-13 LAB — LIPASE, BLOOD: Lipase: 20 U/L (ref 11–59)

## 2013-08-13 LAB — URINALYSIS, ROUTINE W REFLEX MICROSCOPIC
Bilirubin Urine: NEGATIVE
Glucose, UA: NEGATIVE mg/dL
Hgb urine dipstick: NEGATIVE
Specific Gravity, Urine: 1.029 (ref 1.005–1.030)
pH: 6 (ref 5.0–8.0)

## 2013-08-13 LAB — POCT PREGNANCY, URINE: Preg Test, Ur: NEGATIVE

## 2013-08-13 LAB — URINE MICROSCOPIC-ADD ON

## 2013-08-13 MED ORDER — SUCRALFATE 1 G PO TABS: 1.0000 g | ORAL_TABLET | Freq: Two times a day (BID) | ORAL | Status: DC

## 2013-08-13 MED ORDER — NITROFURANTOIN MONOHYD MACRO 100 MG PO CAPS: 100.0000 mg | ORAL_CAPSULE | Freq: Two times a day (BID) | ORAL | Status: DC

## 2013-08-13 MED ORDER — GI COCKTAIL ~~LOC~~
30.0000 mL | Freq: Once | ORAL | Status: AC
  Administered 2013-08-13: 30 mL via ORAL
  Filled 2013-08-13: qty 30

## 2013-08-13 NOTE — ED Provider Notes (Signed)
CSN: [id number]     Arrival date & time 08/13/13  1747 History   First MD Initiated Contact with Patient 08/13/13 1830     Chief Complaint  Patient presents with  . Cough  . Abdominal Pain   (Consider location/radiation/quality/duration/timing/severity/associated sxs/prior Treatment) HPI Lisa Bond is a 22 y.o. female who presents to emergency department with two complaints. States she has been having upper abdominal pain radiating into the back for several weeks and cough and URI symptoms for about 3 days. States abdominal pain has been constant, associated with nausea. Nothing making it better or worse. No diarrhea or vomiting. Taking ibuprofen for this with no relief. Denies urinary or vaginal symptoms. States cough for 3 days, with hemoptysis. States just small tinges of blood in sputum. No chest pain, no SOB. States also having nasal congestion and sore throat. Not taking anything for this. No other complaints. No fever, chills.  Past Medical History  Diagnosis Date  . Infection 2012    GC, chlamydia  . Asthma    Past Surgical History  Procedure Laterality Date  . Knee surgery      left   Family History  Problem Relation Age of Onset  . Heart disease Father    History  Substance Use Topics  . Smoking status: Former Smoker -- 0.25 packs/day for 2 years    Types: Cigarettes  . Smokeless tobacco: Never Used  . Alcohol Use: No   OB History   Grav Para Term Preterm Abortions TAB SAB Ect Mult Living   0              Review of Systems  Constitutional: Negative for fever and chills.  HENT: Positive for congestion and sore throat. Negative for trouble swallowing.   Respiratory: Positive for cough. Negative for chest tightness and shortness of breath.   Cardiovascular: Negative for chest pain, palpitations and leg swelling.  Gastrointestinal: Positive for nausea and abdominal pain. Negative for vomiting and diarrhea.  Genitourinary: Negative for dysuria, flank pain,  vaginal bleeding, vaginal discharge, vaginal pain and pelvic pain.  Musculoskeletal: Negative for arthralgias, myalgias, neck pain and neck stiffness.  Skin: Negative for rash.  Neurological: Negative for dizziness, weakness and headaches.  All other systems reviewed and are negative.    Allergies  Amoxicillin and Penicillins  Home Medications   Current Outpatient Rx  Name  Route  Sig  Dispense  Refill  . EPINEPHrine (EPI-PEN) 0.3 mg/0.3 mL SOAJ injection   Intramuscular   Inject 0.3 mg into the muscle once.          BP 129/80  Pulse 81  Temp(Src) 98.1 F (36.7 C) (Oral)  Resp 18  SpO2 99%  LMP 08/07/2013 Physical Exam  Nursing note and vitals reviewed. Constitutional: She is oriented to person, place, and time. She appears well-developed and well-nourished. No distress.  HENT:  Head: Normocephalic.  Eyes: Conjunctivae are normal.  Neck: Neck supple.  Cardiovascular: Normal rate, regular rhythm and normal heart sounds.   Pulmonary/Chest: Effort normal and breath sounds normal. No respiratory distress. She has no wheezes. She has no rales.  Abdominal: Soft. Bowel sounds are normal. She exhibits no distension. There is tenderness. There is no rebound.  RUQ tenderness, epigastric tenderness  Musculoskeletal: She exhibits no edema.  Neurological: She is alert and oriented to person, place, and time.  Skin: Skin is warm and dry.  Psychiatric: She has a normal mood and affect. Her behavior is normal.    ED Course  Procedures (including critical care time) Labs Review Labs Reviewed  URINALYSIS, ROUTINE W REFLEX MICROSCOPIC - Abnormal; Notable for the following:    APPearance CLOUDY (*)    Leukocytes, UA SMALL (*)    All other components within normal limits  COMPREHENSIVE METABOLIC PANEL - Abnormal; Notable for the following:    Total Bilirubin 0.2 (*)    All other components within normal limits  URINE MICROSCOPIC-ADD ON - Abnormal; Notable for the following:     Squamous Epithelial / LPF FEW (*)    Bacteria, UA MANY (*)    All other components within normal limits  URINE CULTURE  CBC WITH DIFFERENTIAL  LIPASE, BLOOD  POCT PREGNANCY, URINE   Imaging Review Dg Chest 2 View  08/13/2013   CLINICAL DATA:  Cough and chest pain.  EXAM: CHEST  2 VIEW  COMPARISON:  None.  FINDINGS: The cardiac silhouette, mediastinal and hilar contours are normal. The lungs are clear. No pleural effusion. The bony thorax is intact.  IMPRESSION: Normal chest x-ray.   Electronically Signed   By: Loralie Champagne M.D.   On: 08/13/2013 19:18   US Abdomen Complete  08/13/2013   CLINICAL DATA:  Abdominal pain.  EXAM: ULTRASOUND ABDOMEN COMPLETE  COMPARISON:  None.  FINDINGS: Gallbladder  Moderately contracted. No definite gallstones, wall thickening or pericholecystic fluid. Negative sonographic Murphy sign.  Common bile duct  Diameter: 3.3 mm  Liver  No focal lesion identified. Within normal limits in parenchymal echogenicity.  IVC  No abnormality visualized.  Pancreas  Visualized portion unremarkable.  Spleen  Size and appearance within normal limits.  Right Kidney  Length: 10.3 cm. Echogenicity within normal limits. No mass or hydronephrosis visualized.  Left Kidney  Length: 10.1 cm. Echogenicity within normal limits. No mass or hydronephrosis visualized.  Abdominal aorta  No aneurysm visualized.  IMPRESSION: Contracted gallbladder but no definite gallstones, wall thickening or pericholecystic fluid.  Normal caliber common bile duct.  Normal sonographic appearance of the liver, spleen, pancreas and both kidneys.   Electronically Signed   By: Loralie Champagne M.D.   On: 08/13/2013 19:46    EKG Interpretation   None       MDM   1. Abdominal pain   2. UTI (lower urinary tract infection)   3. URI (upper respiratory infection)      Patient with URI symptoms and right upper quadrant and epigastric abdominal pain radiating to the back. Patient also admits to nausea. Chest x-ray  obtained due to the cough and bloody sputum and is negative. Labs are unremarkable. Ultrasound abdomen obtained to rule out cholelithiasis or cholecystitis. Ultrasound is normal. Gallbladder was contracted on the exam but no definite gallstones or wall thickening was seen. Issues urine is contaminated but did show many bacteria Will sternum biotic. Culture sent. For followup with primary care Dr. Patient is otherwise nontoxic appearing with normal vital signs  Filed Vitals:   08/13/13 1812 08/13/13 2124  BP: 129/80 122/77  Pulse: 81 84  Temp: 98.1 F (36.7 C) 98 F (36.7 C)  TempSrc: Oral   Resp: 18 18  SpO2: 99% 99%       Tanicka Bisaillon A Shelagh Rayman, PA-C 08/13/13 2351

## 2013-08-13 NOTE — ED Notes (Signed)
Pt c/o productive cough with bloody sputum x3days, center abdominal pain radiating to back, c/o nausea, no vomiting or diarrhea

## 2013-08-14 LAB — URINE CULTURE: Colony Count: 50000

## 2013-08-18 NOTE — ED Provider Notes (Signed)
Medical screening examination/treatment/procedure(s) were performed by non-physician practitioner and as supervising physician I was immediately available for consultation/collaboration.  EKG Interpretation   None         Richardean Canal, MD 08/18/13 1056

## 2013-09-22 ENCOUNTER — Emergency Department (HOSPITAL_COMMUNITY)
Admission: EM | Admit: 2013-09-22 | Discharge: 2013-09-22 | Disposition: A | Discharge status: Home / Self Care | Payer: Self-pay | Attending: Emergency Medicine | Admitting: Emergency Medicine

## 2013-09-22 ENCOUNTER — Encounter (HOSPITAL_COMMUNITY): Payer: Self-pay | Admitting: Emergency Medicine

## 2013-09-22 DIAGNOSIS — R112 Nausea with vomiting, unspecified: Secondary | ICD-10-CM | POA: Insufficient documentation

## 2013-09-22 DIAGNOSIS — J40 Bronchitis, not specified as acute or chronic: Secondary | ICD-10-CM | POA: Insufficient documentation

## 2013-09-22 DIAGNOSIS — Z87891 Personal history of nicotine dependence: Secondary | ICD-10-CM | POA: Insufficient documentation

## 2013-09-22 DIAGNOSIS — R5381 Other malaise: Secondary | ICD-10-CM | POA: Insufficient documentation

## 2013-09-22 DIAGNOSIS — H9209 Otalgia, unspecified ear: Secondary | ICD-10-CM | POA: Insufficient documentation

## 2013-09-22 DIAGNOSIS — Z8619 Personal history of other infectious and parasitic diseases: Secondary | ICD-10-CM | POA: Insufficient documentation

## 2013-09-22 DIAGNOSIS — R52 Pain, unspecified: Secondary | ICD-10-CM | POA: Insufficient documentation

## 2013-09-22 DIAGNOSIS — Z880 Allergy status to penicillin: Secondary | ICD-10-CM | POA: Insufficient documentation

## 2013-09-22 DIAGNOSIS — J45901 Unspecified asthma with (acute) exacerbation: Secondary | ICD-10-CM | POA: Insufficient documentation

## 2013-09-22 DIAGNOSIS — J029 Acute pharyngitis, unspecified: Secondary | ICD-10-CM | POA: Insufficient documentation

## 2013-09-22 MED ORDER — PROMETHAZINE HCL 25 MG PO TABS: 25.0000 mg | ORAL_TABLET | Freq: Four times a day (QID) | ORAL | Status: DC | PRN

## 2013-09-22 MED ORDER — ALBUTEROL SULFATE HFA 108 (90 BASE) MCG/ACT IN AERS
2.0000 | INHALATION_SPRAY | RESPIRATORY_TRACT | Status: DC | PRN
  Administered 2013-09-22: 2 via RESPIRATORY_TRACT
  Filled 2013-09-22: qty 6.7

## 2013-09-22 MED ORDER — BENZONATATE 100 MG PO CAPS: 100.0000 mg | ORAL_CAPSULE | Freq: Three times a day (TID) | ORAL | Status: DC

## 2013-09-22 NOTE — ED Notes (Signed)
Pt states she has been coughing green stuff, vomiting, aching and having chills for the last 2 days

## 2013-09-22 NOTE — ED Provider Notes (Signed)
CSN: [id number]     Arrival date & time 09/22/13  1303 History  This chart was scribed for non-physician practitioner, Rhea Bleacher, PA-C working with Junius Argyle, MD by Greggory Stallion, ED scribe. This patient was seen in room WTR6/WTR6 and the patient's care was started at 4:42 PM.    Chief Complaint  Patient presents with  . Influenza   The history is provided by the patient. No language interpreter was used.   HPI Comments: Lisa Bond is a 22 y.o. female who presents to the Emergency Department complaining of productive cough of green sputum, nausea, emesis, generalized body aches and chills that started 2 days ago. She states she is also having sore throat, bilateral ear pain and wheezing. Denies fever, rhinorrhea, congestion, leg swelling. Denies history of asthma or other lung problems. Pt has not had a flu shot this year.   Past Medical History  Diagnosis Date  . Infection 2012    GC, chlamydia  . Asthma    Past Surgical History  Procedure Laterality Date  . Knee surgery      left   Family History  Problem Relation Age of Onset  . Heart disease Father    History  Substance Use Topics  . Smoking status: Former Smoker -- 0.25 packs/day for 2 years    Types: Cigarettes  . Smokeless tobacco: Never Used  . Alcohol Use: No   OB History   Grav Para Term Preterm Abortions TAB SAB Ect Mult Living   0              Review of Systems  Constitutional: Positive for chills and fatigue. Negative for fever.  HENT: Positive for ear pain and sore throat. Negative for congestion and rhinorrhea.   Respiratory: Positive for cough and wheezing.   Cardiovascular: Negative for leg swelling.  Gastrointestinal: Positive for nausea and vomiting.  All other systems reviewed and are negative.    Allergies  Amoxicillin and Penicillins  Home Medications   Current Outpatient Rx  Name  Route  Sig  Dispense  Refill  . EPINEPHrine (EPI-PEN) 0.3 mg/0.3 mL SOAJ injection  Intramuscular   Inject 0.3 mg into the muscle once.          BP 122/66  Pulse 98  Temp(Src) 99.3 F (37.4 C) (Oral)  Resp 18  SpO2 98%  LMP 09/17/2013  Physical Exam  Nursing note and vitals reviewed. Constitutional: She appears well-developed and well-nourished. No distress.  HENT:  Head: Normocephalic and atraumatic.  Right Ear: Tympanic membrane, external ear and ear canal normal.  Left Ear: Tympanic membrane, external ear and ear canal normal.  Nose: Mucosal edema present.  Mouth/Throat: Mucous membranes are normal. Mucous membranes are not dry. Posterior oropharyngeal erythema present. No oropharyngeal exudate, posterior oropharyngeal edema or tonsillar abscesses.  Eyes: Conjunctivae and EOM are normal. Pupils are equal, round, and reactive to light. Right eye exhibits no discharge. Left eye exhibits no discharge.  Neck: Normal range of motion. Neck supple. No tracheal deviation present.  Cardiovascular: Normal rate, regular rhythm and normal heart sounds.   No murmur heard. Pulmonary/Chest: Effort normal and breath sounds normal. No respiratory distress. She has no wheezes. She has no rales.  Abdominal: Soft. There is no tenderness.  Musculoskeletal: Normal range of motion.  Neurological: She is alert.  Skin: Skin is warm and dry.  Psychiatric: She has a normal mood and affect. Her behavior is normal.    ED Course  Procedures (including critical care  time)  DIAGNOSTIC STUDIES: Oxygen Saturation is 98% on RA, normal by my interpretation.    COORDINATION OF CARE: 4:50 PM-Discussed treatment plan which includes inhaler, nausea medication, tessalon and ibuprofen with pt at bedside and pt agreed to plan.   Labs Review Labs Reviewed - No data to display Imaging Review No results found.  EKG Interpretation   None      Vital signs reviewed and are as follows: Filed Vitals:   09/22/13 1341  BP: 122/66  Pulse: 98  Temp: 99.3 F (37.4 C)  Resp: 18   5:01 PM  Patient counseled on supportive care for viral URI and s/s to return including worsening symptoms, persistent fever, persistent vomiting, or if they have any other concerns.  Urged to see PCP if symptoms persist for more than 3 days. Patient verbalizes understanding and agrees with plan.   Plan: tessalon, albuterol for cough. Phenergan for nausea.   Patient counseled on use of albuterol HFA.  Told to use 1-2 puffs q 4 hours as needed for SOB.    MDM   1. Bronchitis    Patient with likely viral bronchitis, although influenza possible. No concern for PNA given normal lung exam. Antibiotics not indicated. Conservative therapy indicated.    She also has nausea/vomiting, but no abdominal pain.    I personally performed the services described in this documentation, which was scribed in my presence. The recorded information has been reviewed and is accurate.   Renne Crigler, PA-C 09/22/13 1702

## 2013-09-22 NOTE — ED Notes (Signed)
Pt says she has been coughing so much that she has been throwing up, 4 times today, unable to keep anything down.

## 2013-09-23 NOTE — ED Provider Notes (Signed)
Medical screening examination/treatment/procedure(s) were performed by non-physician practitioner and as supervising physician I was immediately available for consultation/collaboration.  EKG Interpretation   None         Junius Argyle, MD 09/23/13 1201

## 2015-04-11 ENCOUNTER — Encounter (HOSPITAL_COMMUNITY): Payer: Self-pay | Admitting: Emergency Medicine

## 2015-04-11 ENCOUNTER — Emergency Department (HOSPITAL_COMMUNITY): Payer: No Typology Code available for payment source

## 2015-04-11 ENCOUNTER — Emergency Department (HOSPITAL_COMMUNITY)
Admission: EM | Admit: 2015-04-11 | Discharge: 2015-04-11 | Disposition: A | Discharge status: Home / Self Care | Payer: No Typology Code available for payment source | Attending: Emergency Medicine | Admitting: Emergency Medicine

## 2015-04-11 DIAGNOSIS — Y92410 Unspecified street and highway as the place of occurrence of the external cause: Secondary | ICD-10-CM | POA: Diagnosis not present

## 2015-04-11 DIAGNOSIS — Y929 Unspecified place or not applicable: Secondary | ICD-10-CM | POA: Diagnosis not present

## 2015-04-11 DIAGNOSIS — Y939 Activity, unspecified: Secondary | ICD-10-CM | POA: Diagnosis not present

## 2015-04-11 DIAGNOSIS — Z872 Personal history of diseases of the skin and subcutaneous tissue: Secondary | ICD-10-CM | POA: Diagnosis not present

## 2015-04-11 DIAGNOSIS — M79622 Pain in left upper arm: Secondary | ICD-10-CM

## 2015-04-11 DIAGNOSIS — J45909 Unspecified asthma, uncomplicated: Secondary | ICD-10-CM | POA: Diagnosis not present

## 2015-04-11 DIAGNOSIS — Y999 Unspecified external cause status: Secondary | ICD-10-CM | POA: Insufficient documentation

## 2015-04-11 DIAGNOSIS — Z87891 Personal history of nicotine dependence: Secondary | ICD-10-CM | POA: Insufficient documentation

## 2015-04-11 DIAGNOSIS — R1110 Vomiting, unspecified: Secondary | ICD-10-CM | POA: Diagnosis not present

## 2015-04-11 DIAGNOSIS — S4992XA Unspecified injury of left shoulder and upper arm, initial encounter: Secondary | ICD-10-CM | POA: Insufficient documentation

## 2015-04-11 MED ORDER — NAPROXEN 500 MG PO TABS: 500.0000 mg | ORAL_TABLET | Freq: Two times a day (BID) | ORAL | Status: DC

## 2015-04-11 MED ORDER — METHOCARBAMOL 500 MG PO TABS: 500.0000 mg | ORAL_TABLET | Freq: Two times a day (BID) | ORAL | Status: DC
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
## 2015-04-11 NOTE — ED Notes (Signed)
Ortho tech at bedside 

## 2015-05-13 ENCOUNTER — Encounter: Payer: Self-pay | Admitting: *Deleted

## 2015-05-13 ENCOUNTER — Emergency Department
Admission: EM | Admit: 2015-05-13 | Discharge: 2015-05-13 | Disposition: A | Discharge status: Home / Self Care | Payer: Self-pay | Attending: Emergency Medicine | Admitting: Emergency Medicine

## 2015-05-13 DIAGNOSIS — N390 Urinary tract infection, site not specified: Secondary | ICD-10-CM

## 2015-05-13 DIAGNOSIS — Z880 Allergy status to penicillin: Secondary | ICD-10-CM | POA: Insufficient documentation

## 2015-05-13 DIAGNOSIS — Z87891 Personal history of nicotine dependence: Secondary | ICD-10-CM | POA: Insufficient documentation

## 2015-05-13 DIAGNOSIS — J069 Acute upper respiratory infection, unspecified: Secondary | ICD-10-CM

## 2015-05-13 DIAGNOSIS — Z3202 Encounter for pregnancy test, result negative: Secondary | ICD-10-CM | POA: Insufficient documentation

## 2015-05-13 LAB — URINALYSIS COMPLETE WITH MICROSCOPIC (ARMC ONLY)
BILIRUBIN URINE: NEGATIVE
Glucose, UA: NEGATIVE mg/dL
Hgb urine dipstick: NEGATIVE
Nitrite: NEGATIVE
PH: 5 (ref 5.0–8.0)
PROTEIN: 30 mg/dL — AB
Specific Gravity, Urine: 1.026 (ref 1.005–1.030)

## 2015-05-13 LAB — POCT PREGNANCY, URINE: PREG TEST UR: NEGATIVE

## 2015-05-13 MED ORDER — PSEUDOEPH-BROMPHEN-DM 30-2-10 MG/5ML PO SYRP: 5.0000 mL | ORAL_SOLUTION | Freq: Three times a day (TID) | ORAL | Status: DC | PRN

## 2015-05-13 MED ORDER — SULFAMETHOXAZOLE-TRIMETHOPRIM 400-80 MG PO TABS: 1.0000 | ORAL_TABLET | Freq: Two times a day (BID) | ORAL | Status: DC

## 2015-05-13 NOTE — Discharge Instructions (Signed)
Take medication as prescribed. Rest. Drink plan of water. Follow up with your primary care physician or the above neck week. Return to the ER for new or worsening concerns.  Upper Respiratory Infection, Adult An upper respiratory infection (URI) is also sometimes known as the common cold. The upper respiratory tract includes the nose, sinuses, throat, trachea, and bronchi. Bronchi are the airways leading to the lungs. Most people improve within 1 week, but symptoms can last up to 2 weeks. A residual cough may last even longer.  CAUSES Many different viruses can infect the tissues lining the upper respiratory tract. The tissues become irritated and inflamed and often become very moist. Mucus production is also common. A cold is contagious. You can easily spread the virus to others by oral contact. This includes kissing, sharing a glass, coughing, or sneezing. Touching your mouth or nose and then touching a surface, which is then touched by another person, can also spread the virus. SYMPTOMS  Symptoms typically develop 1 to 3 days after you come in contact with a cold virus. Symptoms vary from person to person. They may include:  Runny nose.  Sneezing.  Nasal congestion.  Sinus irritation.  Sore throat.  Loss of voice (laryngitis).  Cough.  Fatigue.  Muscle aches.  Loss of appetite.  Headache.  Low-grade fever. DIAGNOSIS  You might diagnose your own cold based on familiar symptoms, since most people get a cold 2 to 3 times a year. Your caregiver can confirm this based on your exam. Most importantly, your caregiver can check that your symptoms are not due to another disease such as strep throat, sinusitis, pneumonia, asthma, or epiglottitis. Blood tests, throat tests, and X-rays are not necessary to diagnose a common cold, but they may sometimes be helpful in excluding other more serious diseases. Your caregiver will decide if any further tests are required. RISKS AND COMPLICATIONS    You may be at risk for a more severe case of the common cold if you smoke cigarettes, have chronic heart disease (such as heart failure) or lung disease (such as asthma), or if you have a weakened immune system. The very young and very old are also at risk for more serious infections. Bacterial sinusitis, middle ear infections, and bacterial pneumonia can complicate the common cold. The common cold can worsen asthma and chronic obstructive pulmonary disease (COPD). Sometimes, these complications can require emergency medical care and may be life-threatening. PREVENTION  The best way to protect against getting a cold is to practice good hygiene. Avoid oral or hand contact with people with cold symptoms. Wash your hands often if contact occurs. There is no clear evidence that vitamin C, vitamin E, echinacea, or exercise reduces the chance of developing a cold. However, it is always recommended to get plenty of rest and practice good nutrition. TREATMENT  Treatment is directed at relieving symptoms. There is no cure. Antibiotics are not effective, because the infection is caused by a virus, not by bacteria. Treatment may include:  Increased fluid intake. Sports drinks offer valuable electrolytes, sugars, and fluids.  Breathing heated mist or steam (vaporizer or shower).  Eating chicken soup or other clear broths, and maintaining good nutrition.  Getting plenty of rest.  Using gargles or lozenges for comfort.  Controlling fevers with ibuprofen or acetaminophen as directed by your caregiver.  Increasing usage of your inhaler if you have asthma. Zinc gel and zinc lozenges, taken in the first 24 hours of the common cold, can shorten the duration  and lessen the severity of symptoms. Pain medicines may help with fever, muscle aches, and throat pain. A variety of non-prescription medicines are available to treat congestion and runny nose. Your caregiver can make recommendations and may suggest nasal or  lung inhalers for other symptoms.  HOME CARE INSTRUCTIONS   Only take over-the-counter or prescription medicines for pain, discomfort, or fever as directed by your caregiver.  Use a warm mist humidifier or inhale steam from a shower to increase air moisture. This may keep secretions moist and make it easier to breathe.  Drink enough water and fluids to keep your urine clear or pale yellow.  Rest as needed.  Return to work when your temperature has returned to normal or as your caregiver advises. You may need to stay home longer to avoid infecting others. You can also use a face mask and careful hand washing to prevent spread of the virus. SEEK MEDICAL CARE IF:   After the first few days, you feel you are getting worse rather than better.  You need your caregiver's advice about medicines to control symptoms.  You develop chills, worsening shortness of breath, or brown or red sputum. These may be signs of pneumonia.  You develop yellow or brown nasal discharge or pain in the face, especially when you bend forward. These may be signs of sinusitis.  You develop a fever, swollen neck glands, pain with swallowing, or white areas in the back of your throat. These may be signs of strep throat. SEEK IMMEDIATE MEDICAL CARE IF:   You have a fever.  You develop severe or persistent headache, ear pain, sinus pain, or chest pain.  You develop wheezing, a prolonged cough, cough up blood, or have a change in your usual mucus (if you have chronic lung disease).  You develop sore muscles or a stiff neck. Document Released: 03/09/2001 Document Revised: 12/06/2011 Document Reviewed: 12/19/2013 [facility] Patient Information 2015 [place], [place]. This information is not intended to replace advice given to you by your health care provider. Make sure you discuss any questions you have with your health care provider.  Urinary Tract Infection A urinary tract infection (UTI) can occur any place along the  urinary tract. The tract includes the kidneys, ureters, bladder, and urethra. A type of germ called bacteria often causes a UTI. UTIs are often helped with antibiotic medicine.  HOME CARE   If given, take antibiotics as told by your doctor. Finish them even if you start to feel better.  Drink enough fluids to keep your pee (urine) clear or pale yellow.  Avoid tea, drinks with caffeine, and bubbly (carbonated) drinks.  Pee often. Avoid holding your pee in for a long time.  Pee before and after having sex (intercourse).  Wipe from front to back after you poop (bowel movement) if you are a woman. Use each tissue only once. GET HELP RIGHT AWAY IF:   You have back pain.  You have lower belly (abdominal) pain.  You have chills.  You feel sick to your stomach (nauseous).  You throw up (vomit).  Your burning or discomfort with peeing does not go away.  You have a fever.  Your symptoms are not better in 3 days. MAKE SURE YOU:   Understand these instructions.  Will watch your condition.  Will get help right away if you are not doing well or get worse. Document Released: 03/01/2008 Document Revised: 06/07/2012 Document Reviewed: 04/13/2012 [facility] Patient Information 2015 [place], [place]. This information is not intended to  replace advice given to you by your health care provider. Make sure you discuss any questions you have with your health care provider. ° °

## 2015-05-13 NOTE — ED Provider Notes (Signed)
[facility] Emergency Department Provider Note  ____________________________________________  Time seen: Approximately 1:00 PM  I have reviewed the triage vital signs and the nursing notes.   HISTORY  Chief Complaint URI   HPI DEMESHIA SHERBURNE is a 24 y.o. female presents to the ER for the complaints of 3 days of runny nose and congestion. Patient reports with some sinus pressure and to the front of her face. Patient reports occasional cough. Patient reports also with sore throat. Patient states that sore throat worse today which prompted her to come to the ER.   Patient reports that several coworkers with viruses similarly. Denies fever. Reports continues to eat and drink well. States sore throat pain as 5 out of 10. Denies nausea, vomiting, fever, chest pain, shortness of breath, wheezing, abdominal pain, dizziness or fall.  Also reports wanting urine checked for infection as reports she has been urinating more for 2 days, but also states drinking more water. Denies dysuria, vaginal discharge or odor, denies pain with urination. Reports no history or UTI. Reports not sexually active. Denies back pain.    Past Medical History  Diagnosis Date  . Infection 2012    GC, chlamydia  . Asthma     There are no active problems to display for this patient.   Past Surgical History  Procedure Laterality Date  . Knee surgery      left    Current Outpatient Rx  Name  Route  Sig  Dispense  Refill  .           .           .           .           .             Allergies Amoxicillin and Penicillins  Family History  Problem Relation Age of Onset  . Heart disease Father     Social History Social History  Substance Use Topics  . Smoking status: Former Smoker -- 0.25 packs/day for 2 years    Types: Cigarettes  . Smokeless tobacco: Never Used  . Alcohol Use: No    Review of Systems Constitutional: No fever/chills Eyes: No visual changes. ENT: runny  nose congestion and sore throat as above.  Cardiovascular: Denies chest pain. Respiratory: Denies shortness of breath. Intermittent cough as above.  Gastrointestinal: No abdominal pain.  No nausea, no vomiting.  No diarrhea.  No constipation.  Genitourinary: Negative for dysuria. Musculoskeletal: Negative for back pain. Skin: Negative for rash. Neurological: Negative for headaches, focal weakness or numbness.  10-point ROS otherwise negative.  ____________________________________________   PHYSICAL EXAM:  VITAL SIGNS: ED Triage Vitals  Enc Vitals Group     BP 05/13/15 1229 101/50 mmHg     Pulse Rate 05/13/15 1229 76     Resp 05/13/15 1229 18     Temp 05/13/15 1229 98.2 F (36.8 C)     Temp Source 05/13/15 1229 Oral     SpO2 05/13/15 1229 96 %     Weight 05/13/15 1229 170 lb (77.111 kg)     Height 05/13/15 1229  (1.6 m)     Head Cir --      Peak Flow --      Pain Score --      Pain Loc --      Pain Edu? --      Excl. in GC? --     Constitutional:  Alert and oriented. Well appearing and in no acute distress. Eyes: Conjunctivae are normal. PERRL. EOMI. Head: Atraumatic. No TTP to frontal or maxillary sinuses.   Ears: no erythema, normal TMs bilaterally.   Nose: mild clear rhinorrhea.   Mouth/Throat: Mucous membranes are moist.  Mild pharyngeal erythema. No tonsillar swelling, exudate or uvular deviation.  Neck: No stridor.  No cervical spine tenderness to palpation. Hematological/Lymphatic/Immunilogical: No cervical lymphadenopathy. Cardiovascular: Normal rate, regular rhythm. Grossly normal heart sounds.  Good peripheral circulation. Respiratory: Normal respiratory effort.  No retractions. Lungs CTAB. Gastrointestinal: Soft and nontender. No distention. Normal Bowel sounds.  No CVA tenderness. Musculoskeletal: No lower or upper extremity tenderness nor edema.  No joint effusions. Bilateral pedal pulses equal and easily palpated. No cervical, thoracic or lumbar TTP.   Neurologic:  Normal speech and language. No gross focal neurologic deficits are appreciated. No gait instability. Skin:  Skin is warm, dry and intact. No rash noted. Psychiatric: Mood and affect are normal. Speech and behavior are normal.  ____________________________________________   LABS (all labs ordered are listed, but only abnormal results are displayed)  Labs Reviewed  URINALYSIS COMPLETEWITH MICROSCOPIC ([facility] ONLY) - Abnormal; Notable for the following:    Color, Urine YELLOW (*)    APPearance CLOUDY (*)    Ketones, ur TRACE (*)    Protein, ur 30 (*)    Leukocytes, UA 3+ (*)    Bacteria, UA RARE (*)    Squamous Epithelial / LPF 6-30 (*)    All other components within normal limits  URINE CULTURE  POCT PREGNANCY, URINE   ____________________________________________  ____________________________________________   INITIAL IMPRESSION / ASSESSMENT AND PLAN / ED COURSE  Pertinent labs & imaging results that were available during my care of the patient were reviewed by me and considered in my medical decision making (see chart for details).  No acute distress. Well appearing. Presents for 3 days runny nose, congestion, sore throat, and intermittent cough. Reports continues to eat and drink well. Also reports urinating frequently but reports drinking more water, denies other changes.   1420:Rapid strep negative in ER . Urinalysis awaiting.   Urinalysis cloudy with rare bacteria with history of increased urinary frequency. Will treat patient for UTI with Bactrim twice a day 3 days. We'll treat your eye with when necessary Bromfed. Rest and fluids. Patient to follow up with primary care physician as needed. Discussed return parameters. Patient verbalized understanding and agreed to plan. ____________________________________________   FINAL CLINICAL IMPRESSION(S) / ED DIAGNOSES  Final diagnoses:  UTI (lower urinary tract infection)  URI (upper respiratory infection)        Renford Dills, NP 05/13/15 1451  Richardean Canal, MD 05/13/15 1524

## 2015-05-13 NOTE — ED Notes (Signed)
Cold urinary symptoms

## 2015-05-16 LAB — CULTURE, GROUP A STREP (THRC)

## 2015-05-16 LAB — URINE CULTURE: Culture: 20000

## 2015-08-01 ENCOUNTER — Encounter (HOSPITAL_COMMUNITY): Payer: Self-pay | Admitting: Emergency Medicine

## 2015-08-01 ENCOUNTER — Emergency Department (HOSPITAL_COMMUNITY)
Admission: EM | Admit: 2015-08-01 | Discharge: 2015-08-01 | Disposition: A | Discharge status: Home / Self Care | Payer: Self-pay | Attending: Emergency Medicine | Admitting: Emergency Medicine

## 2015-08-01 DIAGNOSIS — K0889 Other specified disorders of teeth and supporting structures: Secondary | ICD-10-CM | POA: Insufficient documentation

## 2015-08-01 DIAGNOSIS — Z880 Allergy status to penicillin: Secondary | ICD-10-CM | POA: Insufficient documentation

## 2015-08-01 DIAGNOSIS — K029 Dental caries, unspecified: Secondary | ICD-10-CM | POA: Insufficient documentation

## 2015-08-01 DIAGNOSIS — Z8619 Personal history of other infectious and parasitic diseases: Secondary | ICD-10-CM | POA: Insufficient documentation

## 2015-08-01 DIAGNOSIS — Z79899 Other long term (current) drug therapy: Secondary | ICD-10-CM | POA: Insufficient documentation

## 2015-08-01 DIAGNOSIS — K002 Abnormalities of size and form of teeth: Secondary | ICD-10-CM | POA: Insufficient documentation

## 2015-08-01 DIAGNOSIS — Z87891 Personal history of nicotine dependence: Secondary | ICD-10-CM | POA: Insufficient documentation

## 2015-08-01 DIAGNOSIS — J45909 Unspecified asthma, uncomplicated: Secondary | ICD-10-CM | POA: Insufficient documentation

## 2015-08-01 MED ORDER — HYDROCODONE-ACETAMINOPHEN 5-325 MG PO TABS
1.0000 | ORAL_TABLET | Freq: Once | ORAL | Status: AC
  Administered 2015-08-01: 1 via ORAL
  Filled 2015-08-01: qty 1

## 2015-08-01 MED ORDER — HYDROCODONE-ACETAMINOPHEN 5-325 MG PO TABS: 2.0000 | ORAL_TABLET | Freq: Four times a day (QID) | ORAL | Status: DC | PRN

## 2015-08-01 MED ORDER — CLINDAMYCIN HCL 150 MG PO CAPS: 450.0000 mg | ORAL_CAPSULE | Freq: Three times a day (TID) | ORAL | Status: DC

## 2015-08-01 NOTE — Discharge Instructions (Signed)
Dental Pain °Dental pain may be caused by many things, including: °· Tooth decay (cavities or caries). Cavities cause the nerve of your tooth to be open to air and hot or cold temperatures. This can cause pain or discomfort. °· Abscess or infection. A dental abscess is an area that is full of infected pus from a bacterial infection in the inner part of the tooth (pulp). It usually happens at the end of the tooth's root. °· Injury. °· An unknown reason (idiopathic). °Your pain may be mild or severe. It may only happen when: °· You are chewing. °· You are exposed to hot or cold temperature. °· You are eating or drinking sugary foods or beverages, such as: °¨ Soda. °¨ Candy. °Your pain may also be there all of the time. °HOME CARE °Watch your dental pain for any changes. Do these things to lessen your discomfort: °· Take medicines only as told by your dentist. °· If your dentist tells you to take an antibiotic medicine, finish all of it even if you start to feel better. °· Keep all follow-up visits as told by your dentist. This is important. °· Do not apply heat to the outside of your face. °· Rinse your mouth or gargle with salt water if told by your dentist. This helps with pain and swelling. °¨ You can make salt water by adding ¼ tsp of salt to 1 cup of warm water. °· Apply ice to the painful area of your face: °¨ Put ice in a plastic bag. °¨ Place a towel between your skin and the bag. °¨ Leave the ice on for 20 minutes, 2-3 times per day. °· Avoid foods or drinks that cause you pain, such as: °¨ Very hot or very cold foods or drinks. °¨ Sweet or sugary foods or drinks. °GET HELP IF: °· Your pain is not helped with medicines. °· Your symptoms are worse. °· You have new symptoms. °GET HELP RIGHT AWAY IF: °· You cannot open your mouth. °· You are having trouble breathing or swallowing. °· You have a fever. °· Your face, neck, or jaw is puffy (swollen). °  °This information is not intended to replace advice given to  you by your health care provider. Make sure you discuss any questions you have with your health care provider. °  °Document Released: 03/01/2008 Document Revised: 01/28/2015 Document Reviewed: 09/09/2014 °Elsevier Interactive Patient Education ©2016 [employer] ° °

## 2015-08-01 NOTE — ED Notes (Signed)
AVS explained in detail. Knows to follow up with dentist ASAP and to take antibiotics until completion. No other c/c.
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
## 2015-08-01 NOTE — ED Provider Notes (Signed)
CSN: [id number]   Arrival date & time 08/01/15 1628  History  By signing my name below, I, Bethel BornBritney McCollum, attest that this documentation has been prepared under the direction and in the presence of Cheri FowlerKayla Kersti Scavone PA-C Electronically Signed: Bethel BornBritney McCollum, ED Scribe. 08/01/2015. 6:55 PM. Chief Complaint  Patient presents with  . Dental Pain    HPI The history is provided by the patient. No language interpreter was used.   Lisa Bond is a 24 y.o. female who presents to the Emergency Department complaining of new and constant upper left dental pain with onset 3 days ago. Associated symptoms include left jaw pain. Chewing exacerbates the pain and she states that she has been unable to eat. Tylenol, ibuprofen, Orajel, Goody Powder, peppermint oil  and mouth wash have provided insufficient relief in pain PTA. Pt denies fever, difficulty breathing, and neck pain.  She has called her dentist but is unable to be seen.   Past Medical History  Diagnosis Date  . Infection 2012    GC, chlamydia  . Asthma     Past Surgical History  Procedure Laterality Date  . Knee surgery      left    Family History  Problem Relation Age of Onset  . Heart disease Father     Social History  Substance Use Topics  . Smoking status: Former Smoker -- 0.25 packs/day for 2 years    Types: Cigarettes  . Smokeless tobacco: Never Used  . Alcohol Use: No     Review of Systems 10 Systems reviewed and all are negative for acute change except as noted in the HPI. Home Medications   Prior to Admission medications   Medication Sig Start Date End Date Taking? Authorizing Provider  benzonatate (TESSALON) 100 MG capsule Take 1 capsule (100 mg total) by mouth every 8 (eight) hours. 09/22/13   Renne CriglerJoshua Geiple, PA-C  brompheniramine-pseudoephedrine-DM 30-2-10 MG/5ML syrup Take 5 mLs by mouth 3 (three) times daily as needed (cough congestion). 05/13/15   Renford DillsLindsey Miller, NP  clindamycin (CLEOCIN) 150 MG capsule Take 3  capsules (450 mg total) by mouth 3 (three) times daily. 08/01/15   Cheri FowlerKayla Merritt Mccravy, PA-C  EPINEPHrine (EPI-PEN) 0.3 mg/0.3 mL SOAJ injection Inject 0.3 mg into the muscle once.    Historical Provider, MD  HYDROcodone-acetaminophen (NORCO/VICODIN) 5-325 MG tablet Take 2 tablets by mouth every 6 (six) hours as needed. 08/01/15   Cheri FowlerKayla Denny Mccree, PA-C  methocarbamol (ROBAXIN) 500 MG tablet Take 1 tablet (500 mg total) by mouth 2 (two) times daily. 04/11/15   Heather Laisure, PA-C  naproxen (NAPROSYN) 500 MG tablet Take 1 tablet (500 mg total) by mouth 2 (two) times daily. 04/11/15   Heather Laisure, PA-C  promethazine (PHENERGAN) 25 MG tablet Take 1 tablet (25 mg total) by mouth every 6 (six) hours as needed for nausea or vomiting. 09/22/13   Renne CriglerJoshua Geiple, PA-C  sulfamethoxazole-trimethoprim (BACTRIM) 400-80 MG per tablet Take 1 tablet by mouth 2 (two) times daily. 05/13/15   Renford DillsLindsey Miller, NP    Allergies  Amoxicillin and Penicillins  Triage Vitals: BP 118/76 mmHg  Pulse 85  Temp(Src) 98.3 F (36.8 C) (Oral)  Resp 16  SpO2 100%  LMP 07/27/2015  Physical Exam  Constitutional: She is oriented to person, place, and time. She appears well-developed and well-nourished.  HENT:  Head: Normocephalic.    Mouth/Throat: Uvula is midline, oropharynx is clear and moist and mucous membranes are normal. No trismus in the jaw. Abnormal dentition. Dental caries present. No  uvula swelling.    Eyes: EOM are normal.  Neck: Normal range of motion. Neck supple.  No evidence of Ludwigs angina  Cardiovascular: Normal rate, regular rhythm and normal heart sounds.   Pulmonary/Chest: Effort normal and breath sounds normal.  Abdominal: Soft. Bowel sounds are normal. She exhibits no distension.  Musculoskeletal: Normal range of motion.  Lymphadenopathy:    She has no cervical adenopathy.  Neurological: She is alert and oriented to person, place, and time.  Psychiatric: She has a normal mood and affect.  Nursing note and  vitals reviewed.   ED Course  Procedures  DIAGNOSTIC STUDIES: Oxygen Saturation is 100% on RA,  normal by my interpretation.    COORDINATION OF CARE: 6:33 PM Discussed treatment plan which includes clindamycin and Norco with pt at bedside and pt agreed to the plan.  Labs Review- Labs Reviewed - No data to display  Imaging Review No results found.  MDM   Final diagnoses:  Pain, dental    Suspect dental pain associated with dental infection and possible dental abscess with patient afebrile, non toxic appearing and swallowing secretions well. I gave patient referral to dentist and stressed the importance of dental follow up for ultimate management of dental pain. Discussed return precautions.  Patient expresses understanding and agrees with plan.  I will also give Clindamycin and pain control.   I personally performed the services described in this documentation, which was scribed in my presence. The recorded information has been reviewed and is accurate.      Cheri Fowler, PA-C 08/01/15 1858  Raeford Razor, MD 08/03/15 [phone]

## 2015-08-02 ENCOUNTER — Telehealth: Payer: Self-pay | Admitting: *Deleted

## 2015-08-02 NOTE — Telephone Encounter (Signed)
Looked up and Texted pt GoodRx coupon which reduced  the cost of the pts Rx of Cleocin to $18.00.  She verbalized that she would be able to afford her medications at this cost. [facility].

## 2015-08-02 NOTE — Telephone Encounter (Signed)
Unable to fill Rx due to $50.00,

## 2015-09-04 ENCOUNTER — Emergency Department (HOSPITAL_BASED_OUTPATIENT_CLINIC_OR_DEPARTMENT_OTHER)
Admission: EM | Admit: 2015-09-04 | Discharge: 2015-09-04 | Disposition: A | Discharge status: Home / Self Care | Payer: Self-pay | Attending: Emergency Medicine | Admitting: Emergency Medicine

## 2015-09-04 ENCOUNTER — Encounter (HOSPITAL_BASED_OUTPATIENT_CLINIC_OR_DEPARTMENT_OTHER): Payer: Self-pay

## 2015-09-04 DIAGNOSIS — H9209 Otalgia, unspecified ear: Secondary | ICD-10-CM | POA: Insufficient documentation

## 2015-09-04 DIAGNOSIS — S032XXA Dislocation of tooth, initial encounter: Secondary | ICD-10-CM | POA: Insufficient documentation

## 2015-09-04 DIAGNOSIS — K047 Periapical abscess without sinus: Secondary | ICD-10-CM

## 2015-09-04 DIAGNOSIS — Z8619 Personal history of other infectious and parasitic diseases: Secondary | ICD-10-CM | POA: Insufficient documentation

## 2015-09-04 DIAGNOSIS — K029 Dental caries, unspecified: Secondary | ICD-10-CM | POA: Insufficient documentation

## 2015-09-04 DIAGNOSIS — Z87891 Personal history of nicotine dependence: Secondary | ICD-10-CM | POA: Insufficient documentation

## 2015-09-04 DIAGNOSIS — J45909 Unspecified asthma, uncomplicated: Secondary | ICD-10-CM | POA: Insufficient documentation

## 2015-09-04 DIAGNOSIS — X58XXXA Exposure to other specified factors, initial encounter: Secondary | ICD-10-CM | POA: Insufficient documentation

## 2015-09-04 DIAGNOSIS — Y998 Other external cause status: Secondary | ICD-10-CM | POA: Insufficient documentation

## 2015-09-04 DIAGNOSIS — K002 Abnormalities of size and form of teeth: Secondary | ICD-10-CM | POA: Insufficient documentation

## 2015-09-04 DIAGNOSIS — Y9389 Activity, other specified: Secondary | ICD-10-CM | POA: Insufficient documentation

## 2015-09-04 DIAGNOSIS — Z880 Allergy status to penicillin: Secondary | ICD-10-CM | POA: Insufficient documentation

## 2015-09-04 DIAGNOSIS — Y9289 Other specified places as the place of occurrence of the external cause: Secondary | ICD-10-CM | POA: Insufficient documentation

## 2015-09-04 MED ORDER — CLINDAMYCIN HCL 150 MG PO CAPS: 450.0000 mg | ORAL_CAPSULE | Freq: Three times a day (TID) | ORAL | Status: DC

## 2015-09-04 MED ORDER — OXYCODONE-ACETAMINOPHEN 5-325 MG PO TABS: 2.0000 | ORAL_TABLET | ORAL | Status: DC | PRN

## 2015-09-04 MED ORDER — OXYCODONE-ACETAMINOPHEN 5-325 MG PO TABS: 1.0000 | ORAL_TABLET | Freq: Once | ORAL | Status: DC

## 2015-09-04 NOTE — ED Notes (Addendum)
C/o left upper abscess x 1 month-[facility]-completed abx approx 2 weeks ago-pt NAD-texting while in triage

## 2015-09-04 NOTE — ED Notes (Signed)
Patient told that she had pain medications ordered and she needed someone at the bedside to drive her home. Reports that her boyfriend was out in the car in the parking lot. About 2 -3 minutes later the pateitn states that he is on facetime "does he really have to come in" - This RN reported yes. Patient is fighiting with her boyfriend and states that he needed to get a taxi here the her gets rides every where else. The patient given d/c paper work after she states that she just wants to go and she does not need the pain meds. She is ready to go.   Patient still fighting with boyfriend on the phone, as this Rn is trying to d/c her - patient signed and walked out.

## 2015-09-04 NOTE — Discharge Instructions (Signed)
Dental Abscess A dental abscess is a collection of pus in or around a tooth. CAUSES This condition is caused by a bacterial infection around the root of the tooth that involves the inner part of the tooth (pulp). It may result from:  Severe tooth decay.  Trauma to the tooth that allows bacteria to enter into the pulp, such as a broken or chipped tooth.  Severe gum disease around a tooth. SYMPTOMS Symptoms of this condition include:  Severe pain in and around the infected tooth.  Swelling and redness around the infected tooth, in the mouth, or in the face.  Tenderness.  Pus drainage.  Bad breath.  Bitter taste in the mouth.  Difficulty swallowing.  Difficulty opening the mouth.  Nausea.  Vomiting.  Chills.  Swollen neck glands.  Fever. DIAGNOSIS This condition is diagnosed with examination of the infected tooth. During the exam, your dentist may tap on the infected tooth. Your dentist will also ask about your medical and dental history and may order X-rays. TREATMENT This condition is treated by eliminating the infection. This may be done with:  Antibiotic medicine.  A root canal. This may be performed to save the tooth.  Pulling (extracting) the tooth. This may also involve draining the abscess. This is done if the tooth cannot be saved. HOME CARE INSTRUCTIONS  Take medicines only as directed by your dentist.  If you were prescribed antibiotic medicine, finish all of it even if you start to feel better.  Rinse your mouth (gargle) often with salt water to relieve pain or swelling.  Do not drive or operate heavy machinery while taking pain medicine.  Do not apply heat to the outside of your mouth.  Keep all follow-up visits as directed by your dentist. This is important. SEEK MEDICAL CARE IF:  Your pain is worse and is not helped by medicine. SEEK IMMEDIATE MEDICAL CARE IF:  You have a fever or chills.  Your symptoms suddenly get worse.  You have a  very bad headache.  You have problems breathing or swallowing.  You have trouble opening your mouth.  You have swelling in your neck or around your eye.   This information is not intended to replace advice given to you by your health care provider. Make sure you discuss any questions you have with your health care provider.   Document Released: 09/13/2005 Document Revised: 01/28/2015 Document Reviewed: 09/10/2014 Elsevier Interactive Patient Education 2016 [place] Extraction A dental extraction is the removal (extraction) of a tooth. You may need to have a dental extraction if:   You have tooth decay or gum disease.  You have an infection (abscess).  Room needs to be made for other teeth to grow in or to be aligned properly.  Baby (primary) teeth are preventing adult (permanent) teeth from coming to the surface (erupting).  You have a tooth fracture or fractures that are not repairable.  You are going to be having radiation to your head and neck. The type and length of procedure that you have depends on the reason for the extraction and the placement of the tooth or teeth that are being removed. The procedure may be:  A simple extraction. This is done if the tooth is visible in the mouth and is above the gumline.  A surgical extraction. This is done if the tooth has not come into the mouth or if the tooth is broken off below the gumline. LET [facility] CARE PROVIDER KNOW ABOUT:  Any allergies you have.  All medicines you are taking, including vitamins, herbs, eye drops, creams, and over-the-counter medicines.  Previous problems you or members of your family have had with the use of anesthetics.  Any blood disorders you have.  Previous surgeries you have had.  Any medical conditions you may have. RISKS AND COMPLICATIONS Generally, this is a safe procedure. However, problems may occur, including:  Damage to surrounding teeth, nerves, tissues, or  structures.  The blood clot does not form or stay in place where the tooth was removed. This causes the bones and nerves underneath to be exposed (dry socket). This can delay healing.  Incomplete extraction of roots.  Jawbone injury, pain, or weakness. BEFORE THE PROCEDURE  Ask your health care provider about:  Changing or stopping your regular medicines. This is especially important if you are taking diabetes medicines or blood thinners.  Taking medicines such as aspirin and ibuprofen. These medicines can thin your blood. Do not take these medicines before your procedure if your health care provider instructs you not to.  Take medicines, such as antibiotic medicines, as directed by your health care provider.  Follow instructions from your health care provider about eating or drinking restrictions.  Plan to have someone take you home after the procedure.  If you go home right after the procedure, plan to have someone with you for 24 hours. PROCEDURE  You may be given one or more of the following:  A medicine that helps you relax (sedative).  A medicine that numbs the area (local anesthetic).  A medicine that makes you fall asleep (general anesthetic).  If you are having a simple extraction:  Your dentist will loosen the tooth with an instrument called an elevator.  Another instrument called forceps will be used to grasp the tooth and remove it from the socket.  The open socket will be cleaned.  Gauze will be placed in the socket to reduce bleeding.  If you are having a surgical extraction:  Your dentist will make an incision in the gum.  Some of the bone around the tooth may need to be removed.  The tooth will be removed.  Stitches (sutures) may be required to close the area. The procedure may vary among health care providers and hospitals. AFTER THE PROCEDURE  You may have gauze in your mouth where the tooth was removed. If directed by your health care provider,  apply gentle pressure on the gauze for up to one hour after the procedure. This will help to control bleeding.  A blood clot should begin to form over the open socket. This is normal. Do not touch the area, and do not rinse it.  You may be given medicines to help control pain and help your recovery.   This information is not intended to replace advice given to you by your health care provider. Make sure you discuss any questions you have with your health care provider.   Document Released: 09/13/2005 Document Revised: 01/28/2015 Document Reviewed: 09/09/2014 Elsevier Interactive Patient Education 2016 Elsevier Inc.  Dental Pain Dental pain may be caused by many things, including:  Tooth decay (cavities or caries). Cavities expose the nerve of your tooth to air and hot or cold temperatures. This can cause pain or discomfort.  Abscess or infection. A dental abscess is a collection of infected pus from a bacterial infection in the inner part of the tooth (pulp). It usually occurs at the end of the tooth's root.  Injury.  An  unknown reason (idiopathic). Your pain may be mild or severe. It may only occur when:  You are chewing.  You are exposed to hot or cold temperature.  You are eating or drinking sugary foods or beverages, such as soda or candy. Your pain may also be constant. HOME CARE INSTRUCTIONS Watch your dental pain for any changes. The following actions may help to lessen any discomfort that you are feeling:  Take medicines only as directed by your dentist.  If you were prescribed an antibiotic medicine, finish all of it even if you start to feel better.  Keep all follow-up visits as directed by your dentist. This is important.  Do not apply heat to the outside of your face.  Rinse your mouth or gargle with salt water if directed by your dentist. This helps with pain and swelling.  You can make salt water by adding  tsp of salt to 1 cup of warm water.  Apply ice to  the painful area of your face:  Put ice in a plastic bag.  Place a towel between your skin and the bag.  Leave the ice on for 20 minutes, 2-3 times per day.  Avoid foods or drinks that cause you pain, such as:  Very hot or very cold foods or drinks.  Sweet or sugary foods or drinks. SEEK MEDICAL CARE IF:  Your pain is not controlled with medicines.  Your symptoms are worse.  You have new symptoms. SEEK IMMEDIATE MEDICAL CARE IF:  You are unable to open your mouth.  You are having trouble breathing or swallowing.  You have a fever.  Your face, neck, or jaw is swollen.   This information is not intended to replace advice given to you by your health care provider. Make sure you discuss any questions you have with your health care provider.    Emergency Department Resource Guide 1) Find a Doctor and Pay Out of Pocket Although you won't have to find out who is covered by your insurance plan, it is a good idea to ask around and get recommendations. You will then need to call the office and see if the doctor you have chosen will accept you as a new patient and what types of options they offer for patients who are self-pay. Some doctors offer discounts or will set up payment plans for their patients who do not have insurance, but you will need to ask so you aren't surprised when you get to your appointment.  2) Contact Your Local Health Department Not all health departments have doctors that can see patients for sick visits, but many do, so it is worth a call to see if yours does. If you don't know where your local health department is, you can check in your phone book. The CDC also has a tool to help you locate your state's health department, and many state websites also have listings of all of their local health departments.  3) Find a Walk-in Clinic If your illness is not likely to be very severe or complicated, you may want to try a walk in clinic. These are popping up all over  the country in pharmacies, drugstores, and shopping centers. They're usually staffed by nurse practitioners or physician assistants that have been trained to treat common illnesses and complaints. They're usually fairly quick and inexpensive. However, if you have serious medical issues or chronic medical problems, these are probably not your best option.  No Primary Care Doctor: - Call Health Connect at  [phone] - they can help you locate a primary care doctor that  accepts your insurance, provides certain services, etc. - Physician Referral Service- [phone]  Chronic Pain Problems: Organization         Address  Phone   Notes  [facility]  [phone]) [phone] Patients need to be referred by their primary care doctor.   Medication Assistance: Organization         Address  Phone   Notes  [facility] Medication [facility] [address] [place], [place] [address] [phone] --Must be a resident of [facility] -- Must have NO insurance coverage whatsoever (no Medicaid/ Medicare, etc.) -- The pt. MUST have a primary care doctor that directs their care regularly and follows them in the community   MedAssist  [phone]   [employer]  [phone]    Agencies that provide inexpensive medical care: Organization         Address  Phone   Notes  [facility]  [phone]   [facility]    [phone]) [phone]   [facility] [address] [place], [place] [address] [phone]) [phone]   [facility] [address], [place] [phone]   Planned Parenthood    [phone]   [facility]    [phone]   [facility]  [address], [place] Phone:  [phone], Fax:  [phone] Hours of Operation:  9 am - 6 pm, M-F.  Also accepts Medicaid/Medicare and self-pay.  [facility]  [address]  [address], [place] Phone: [phone], Fax: [phone]) [phone]. Hours of Operation:  8:30 am - 5:30 pm, M-F.  Also accepts Medicaid and self-pay.  [facility] [place] [address], [place] Phone: [phone]) [phone]   [facility] [address], [place], [place] [phone], Ext. [phone] Mondays & Thursdays: 7-9 AM.  First 15 patients are seen on a first come, first serve basis.    Medicaid-accepting [facility] Providers:  Organization         Address  Phone   Notes  [facility] [address], [place] [phone] Also accepts self-pay patients.  [facility] [address], [place]  [phone]   [facility] [address], [place] [phone]) [phone]   [facility] [address] [place] [address], [place] [phone]   Renaye RakersVeita Bland [address], [place]   [phone]) [phone] Only accepts [place] Access [place] patients after they have their name applied to their card.   Self-Pay (no insurance) in [facility]:  Organization         Address  Phone   Notes  Sickle Cell Patients, [facility] [address], [place] [phone]   [facility] [address], [place] [phone]) [phone]   [facility] [place]  [address], [place] [phone]) [phone]   [facility]/Dr. Osei-Bonsu  [address] [place] [address], [place] or [id number], [place] [phone] Phone number for both [place] and [place] locations is the same.  [facility] [address], [place] [phone]) [phone]   [facility] [place] [address] [place] [address], [place] or  [address] [phone]   [facility] [address], [place] [phone], phone; [phone], fax Sees patients 1st and 3rd Saturday of every month.   Must not qualify for public or private insurance (i.e. Medicaid, Medicare, [place] Health Choice, Veterans' Benefits)  Household income should be no more than 200% of the poverty level The clinic cannot treat you if you are pregnant or think you are pregnant  Sexually transmitted diseases are not treated at the clinic.    Dental Care: Organization         Address  Phone  Notes  [facility] Department of [facility] [address] [place], [place] [phone] Accepts children up to age 69 who are enrolled in [place] or [place] Health Choice; pregnant women with a Medicaid card; and children who have applied for Medicaid or [place] Health Choice, but were declined, whose parents can pay a reduced fee at time of service.  [facility] Department of [facility]  [address], [place] [phone] Accepts children up to age 72 who are enrolled in [place] or [place] Health Choice; pregnant women with a Medicaid card; and children who have applied for Medicaid or  Health Choice, but were declined, whose parents can pay a reduced fee at time of service.  [facility]  [address] [place], [place] [phone] Patients are seen by appointment only. Walk-ins are not accepted. [facility] will see patients 40 years of age and older. Monday - Tuesday (8am-5pm) Most Wednesdays (8:30-5pm) $30 per visit, cash only  [facility] Dental Access PROGRAM  [address], [facility] [phone] Patients are seen by appointment only. Walk-ins are not accepted. [facility] will see patients 45 years of age and older. One Wednesday Evening (Monthly: Volunteer Based).  $30 per visit, cash only  [employer]  [phone] for adults; Children under age 53, call Graduate Pediatric Dentistry at [phone]. Children aged 87-14, please call [phone] to request a pediatric application.  Dental services are  provided in all areas of dental care including fillings, crowns and bridges, complete and partial dentures, implants, gum treatment, root canals, and extractions. Preventive care is also provided. Treatment is provided to both adults and children. Patients are selected via a lottery and there is often a waiting list.   [facility] [address], [place]  [phone] [email or web address]   [facility] [address] [place], [place] [phone], Ext. [phone] Second and Fourth Thursday of each month, opens at 6:30 AM; Clinic ends at 9 AM.  Patients are seen on a first-come first-served basis, and a limited number are seen during each clinic.   [facility]  [address] [place], [place] [phone]   Eligibility Requirements You must have lived in [place], [place], or [place] counties for at least the last three months.   You cannot be eligible for state or federal sponsored [place], including [employer], [place], or [employer].   You generally cannot be eligible for healthcare insurance through your employer.    How to apply: Eligibility screenings are held every Tuesday and Wednesday afternoon from 1:00 pm until 4:00 pm. You do not need an appointment for the interview!  [facility] [address], [place], [place] [phone]   [facility] Department  [phone]   [email or web address]  [employer] Health Department  [phone]   [facility] Health Department  [phone]    Behavioral Health Resources in the Community: Intensive Outpatient Programs Organization         Address  Phone  Notes  [facility] Services [address] [place], [place] [phone]   [facility] Outpatient [address], [place], [place] [phone]   [facility] [address] [place], [place]  [phone]   [facility] Mental Health [address]  [place], [place]  [phone] or [phone]   Substance Abuse Resources Organization         Address  Phone  Notes  Alcohol [facility] Drug Services  [phone]   [facility]  [phone]   [facility]  [phone]   Floydene Flock  [phone]   Residential & Outpatient Substance Abuse Program  [phone]   Psychological Services Organization         Address  Phone  Notes  [facility] Behavioral Health  [phone]   [facility] Services  [phone]   [facility] Mental Health [address] [place] [phone] or [phone]    Mobile Crisis Teams Organization         Address  Phone  Notes  [facility], Mobile Crisis Care Unit  [phone]   [facility]  [address] [place], [place] [phone]   Doristine Locks [address] [place] [phone]    Self-Help/Support Groups Organization         Address  Phone             Notes  [facility] [place] - variety of support groups  336- I7437963 Call for more information  Narcotics Anonymous (NA), [facility]aring [facility] [address], [employer] [place]  2 meetings at this location   Statistician         Address  Phone  Notes  [facility] [address],    [place]  [phone]   [facility]  [address] [place] [address], [place], [place] [phone]   [facility] [address] [place], [place] [phone] Admissions: 8am-3pm M-F  [facility] [address],    [place], [place] [phone]   [facility] [address] [place], [place], [place] [phone]   [facility] [address]  [place], [place] [phone]   [facility] - Intensive Outpatient [address], [place], [place] [phone]   [facility] (Addiction Recovery [facility] [address] [place]  [place], [place] [phone] or  [phone]   [facility] ([facility]) [address] [place], [place] [phone] Accepts Medicaid  [facility] [address],  [place] [phone] Substance Abuse/Addiction Treatment   [facility] Organization         Address  Phone  Notes  [facility]  [phone]   Angie Fava, PhD [address] [place], [place]   [phone] or [phone]   [facility] Behavioral   [address] [place], [place] [phone]   [facility] [address], [place], [place] [phone] Insurance/Medicaid/sponsorship through [employer] and Families [address]  [place], [place] [phone] Therapy/tele-psych/case  [facility] [address]   [place], [place] [phone]    Dr. Lolly Mustache  [phone]   [facility]  [employer] [facility] Dept. 1) [address] [place] 2) [address] [place] 3)  [address], [place] [phone]  [phone]   [facility] Child Abuse Hotline 661-729-0433 or (916)252-4997 (After Hours)      Make an appointment with the dentist as soon as possible for consultation and potential tooth extraction. Take anabiotic as prescribed. Return to the emergency department if she x-rays worsening of her symptoms, swelling of your tongue or lips, fever, difficulty swallowing or breathing.

## 2015-09-06 NOTE — ED Provider Notes (Signed)
CSN: [id number]     Arrival date & time 09/04/15  1319 History   First MD Initiated Contact with Patient 09/04/15 1453     Chief Complaint  Patient presents with  . Abscess     (Consider location/radiation/quality/duration/timing/severity/associated sxs/prior Treatment) HPI   Gweneth FritterKeyona Mangiapane is a 24 y.o F with no significant pmhx who presents to the ED c/o dental pain. Pt states that her back, left upper molar began hurting approx 2.5 weeks ago. Pt reports associated subjective fevers, otalgia, painful chewing. [facility] for same and was given clindamycin and pain medication. Pt states that this improved her pain significantly for 1 week, but after she finished the clindamycin the pain returned. Pt was unable to see her dentist during this time. Denies facial swelling, difficulty breathing or swallowing, N/V/D.   Past Medical History  Diagnosis Date  . Infection 2012    GC, chlamydia  . Asthma    Past Surgical History  Procedure Laterality Date  . Knee surgery      left   Family History  Problem Relation Age of Onset  . Heart disease Father    Social History  Substance Use Topics  . Smoking status: Former Smoker -- 0.25 packs/day for 2 years    Types: Cigarettes  . Smokeless tobacco: Never Used  . Alcohol Use: No   OB History    Gravida Para Term Preterm AB TAB SAB Ectopic Multiple Living   0              Review of Systems  Constitutional: Negative for diaphoresis.  HENT: Positive for dental problem. Negative for drooling and facial swelling.   All other systems reviewed and are negative.     Allergies  Amoxicillin and Penicillins  Home Medications   Prior to Admission medications   Medication Sig Start Date End Date Taking? Authorizing Provider  clindamycin (CLEOCIN) 150 MG capsule Take 3 capsules (450 mg total) by mouth 3 (three) times daily. 09/04/15   Samantha Tripp Dowless, PA-C  oxyCODONE-acetaminophen (PERCOCET/ROXICET)  5-325 MG tablet Take 2 tablets by mouth every 4 (four) hours as needed for severe pain. 09/04/15   Samantha Tripp Dowless, PA-C   BP 117/77 mmHg  Pulse 102  Temp(Src) 98.7 F (37.1 C) (Oral)  Resp 16  Ht 5\' 2"  (1.575 m)  Wt 74.844 kg  BMI 30.17 kg/m2  SpO2 100%  LMP 08/19/2015 Physical Exam  Constitutional: She is oriented to person, place, and time. She appears well-developed and well-nourished. No distress.  HENT:  Head: Normocephalic and atraumatic.  Nose: Nose normal.  Mouth/Throat: Uvula is midline and mucous membranes are normal. No trismus in the jaw. Abnormal dentition. Dental caries present. No dental abscesses or uvula swelling. No oropharyngeal exudate, posterior oropharyngeal edema, posterior oropharyngeal erythema or tonsillar abscesses.    TMs clear bilaterally.   Eyes: Conjunctivae are normal. Right eye exhibits no discharge. Left eye exhibits no discharge. No scleral icterus.  Neck: Neck supple.  Cardiovascular: Normal rate.   Pulmonary/Chest: Effort normal.  Lymphadenopathy:    She has no cervical adenopathy.  Neurological: She is alert and oriented to person, place, and time. Coordination normal.  Skin: Skin is warm and dry. No rash noted. She is not diaphoretic. No erythema. No pallor.  Psychiatric: She has a normal mood and affect. Her behavior is normal.  Nursing note and vitals reviewed.   ED Course  Procedures (including critical care time) Labs Review Labs  Reviewed - No data to display  Imaging Review No results found. I have personally reviewed and evaluated these images and lab results as part of my medical decision-making.   EKG Interpretation None      MDM   Final diagnoses:  Dental infection    Patient with toothache.  No gross abscess.  Exam unconcerning for Ludwig's angina or spread of infection.  Will treat with clindamycin as pt is PCN allergic, and pain medicine.  Urged patient to follow-up with dentist. Pt given referral and  resource guide. Return precautions outlined in patient discharge instructions.       Lester Kinsman Winfield, PA-C 09/06/15 1556  Loren Racer, MD 09/11/15 [phone]

## 2015-09-24 ENCOUNTER — Encounter (HOSPITAL_BASED_OUTPATIENT_CLINIC_OR_DEPARTMENT_OTHER): Payer: Self-pay | Admitting: Emergency Medicine

## 2015-09-24 ENCOUNTER — Emergency Department (HOSPITAL_BASED_OUTPATIENT_CLINIC_OR_DEPARTMENT_OTHER)
Admission: EM | Admit: 2015-09-24 | Discharge: 2015-09-24 | Disposition: A | Discharge status: Home / Self Care | Payer: Self-pay | Attending: Emergency Medicine | Admitting: Emergency Medicine

## 2015-09-24 DIAGNOSIS — Z880 Allergy status to penicillin: Secondary | ICD-10-CM | POA: Insufficient documentation

## 2015-09-24 DIAGNOSIS — K047 Periapical abscess without sinus: Secondary | ICD-10-CM | POA: Insufficient documentation

## 2015-09-24 DIAGNOSIS — Z8619 Personal history of other infectious and parasitic diseases: Secondary | ICD-10-CM | POA: Insufficient documentation

## 2015-09-24 DIAGNOSIS — J45909 Unspecified asthma, uncomplicated: Secondary | ICD-10-CM | POA: Insufficient documentation

## 2015-09-24 DIAGNOSIS — Z87891 Personal history of nicotine dependence: Secondary | ICD-10-CM | POA: Insufficient documentation

## 2015-09-24 MED ORDER — OXYCODONE-ACETAMINOPHEN 5-325 MG PO TABS: 1.0000 | ORAL_TABLET | ORAL | Status: DC | PRN

## 2015-09-24 MED ORDER — IBUPROFEN 800 MG PO TABS: 800.0000 mg | ORAL_TABLET | Freq: Three times a day (TID) | ORAL | Status: DC | PRN

## 2015-09-24 MED ORDER — CLINDAMYCIN HCL 300 MG PO CAPS: 300.0000 mg | ORAL_CAPSULE | Freq: Four times a day (QID) | ORAL | Status: DC

## 2015-09-24 NOTE — ED Notes (Signed)
Swelling to left side of face secondary to tooth abscess, pt has been unable to get into see a dentist for abscess

## 2015-09-24 NOTE — ED Notes (Signed)
Pt given ice pack per request and list of dental resources. Verbalizes understanding of importance of finishing ALL of antibiotics, even if she feels better.

## 2015-09-24 NOTE — ED Provider Notes (Signed)
CSN: [id number]     Arrival date & time 09/24/15  1106 History   First MD Initiated Contact with Patient 09/24/15 1202     Chief Complaint  Patient presents with  . Facial Swelling     (Consider location/radiation/quality/duration/timing/severity/associated sxs/prior Treatment) HPI   Pt presents with two days of swelling in her left upper molar and face adjacent to a tooth that has been intermittently painful for a long time.  States she has come to the ED several times and has started antibiotics but did not understand that she needed to complete the course, stoped taking them every time as soon as she started feeling better.  Has an appointment with Dr Manson PasseyBrown, oral surgeon, next month.  Denies fevers, sore throat, difficulty swallowing or breathing.    Past Medical History  Diagnosis Date  . Infection 2012    GC, chlamydia  . Asthma    Past Surgical History  Procedure Laterality Date  . Knee surgery      left   Family History  Problem Relation Age of Onset  . Heart disease Father    Social History  Substance Use Topics  . Smoking status: Former Smoker -- 0.25 packs/day for 2 years    Types: Cigarettes  . Smokeless tobacco: Never Used  . Alcohol Use: No   OB History    Gravida Para Term Preterm AB TAB SAB Ectopic Multiple Living   0              Review of Systems  Constitutional: Negative for fever and chills.  HENT: Positive for dental problem and facial swelling. Negative for sore throat and trouble swallowing.   Respiratory: Negative for shortness of breath and stridor.   Musculoskeletal: Negative for myalgias, neck pain and neck stiffness.  Skin: Negative for color change.  Allergic/Immunologic: Negative for immunocompromised state.  Psychiatric/Behavioral: Negative for self-injury.      Allergies  Amoxicillin and Penicillins  Home Medications   Prior to Admission medications   Medication Sig Start Date End Date Taking? Authorizing Provider   clindamycin (CLEOCIN) 300 MG capsule Take 1 capsule (300 mg total) by mouth 4 (four) times daily. X 10 days 09/24/15   Trixie DredgeEmily Janelle Spellman, PA-C  ibuprofen (ADVIL,MOTRIN) 800 MG tablet Take 1 tablet (800 mg total) by mouth every 8 (eight) hours as needed for mild pain or moderate pain. 09/24/15   Trixie DredgeEmily Venera Privott, PA-C  oxyCODONE-acetaminophen (PERCOCET/ROXICET) 5-325 MG tablet Take 1-2 tablets by mouth every 4 (four) hours as needed for moderate pain or severe pain. 09/24/15   Trixie DredgeEmily Jaquann Guarisco, PA-C   BP 107/71 mmHg  Pulse 96  Temp(Src) 98 F (36.7 C) (Oral)  Resp 20  Wt 74.844 kg  SpO2 100%  LMP 09/18/2015 Physical Exam  Constitutional: She appears well-developed and well-nourished. No distress.  HENT:  Head: Normocephalic and atraumatic.  Mouth/Throat: Uvula is midline and oropharynx is clear and moist. Mucous membranes are not dry. No uvula swelling. No oropharyngeal exudate, posterior oropharyngeal edema, posterior oropharyngeal erythema or tonsillar abscesses.  Left upper second molar abnormal with tenderness, adjacent swelling into the left maxillary space.  Oropharynx otherwise clear.    Eyes: Conjunctivae are normal.  Neck: Trachea normal, normal range of motion and phonation normal. Neck supple. No tracheal tenderness present. No rigidity. No tracheal deviation, no edema, no erythema and normal range of motion present.  Cardiovascular: Normal rate and regular rhythm.   Pulmonary/Chest: Effort normal and breath sounds normal. No stridor. No respiratory distress. She has  no wheezes. She has no rales.  Lymphadenopathy:    She has no cervical adenopathy.  Neurological: She is alert.  Skin: She is not diaphoretic.  Nursing note and vitals reviewed.   ED Course  Procedures (including critical care time) Labs Review Labs Reviewed - No data to display  Imaging Review No results found. I have personally reviewed and evaluated these images and lab results as part of my medical decision-making.    EKG Interpretation None      MDM   Final diagnoses:  Dental abscess    Afebrile, nontoxic patient with recurrent dental pain with obvious abscess.  No airway concerns.  Doubt Ludwig's angina.  D/C home with antibiotic, pain medication and dental follow up.  Pt has appt with oral surgeon in a few weeks.  Discussed findings, treatment, and follow up  with patient.  Pt given return precautions.  Pt verbalizes understanding and agrees with plan.         Trixie Dredge, PA-C 09/24/15 1222  Jerelyn Scott, MD 09/24/15 1247

## 2015-09-24 NOTE — ED Notes (Addendum)
Pt states that she has been seen 5 times in the er for this same tooth infection. Pt has 2 bottles of clindamycin with her, both more than half full of capsules. Pt states that once she feels better she stops taking the atbx. Pt educated on antibiotics, and that she needs to finish her ENTIRE prescription, even if she is feeling better. Pt states "no one ever told me that..." pt states she cannot afford to see a dentist, that she has called around for prices and "it's gonna be over $400, I can't afford to pay that.Lisa Bond.Lisa Bond."

## 2015-09-24 NOTE — Discharge Instructions (Signed)
Read the information below.  Use the prescribed medication as directed.  Please discuss all new medications with your pharmacist.  Do not take additional tylenol while taking the prescribed pain medication to avoid overdose.  You may return to the Emergency Department at any time for worsening condition or any new symptoms that concern you.    If there is any possibility that you might be pregnant, please let your health care provider know and discuss this with the pharmacist to ensure medication safety.    Please follow up with Dr Manson PasseyBrown as planned.  If you develop fevers, increased swelling in your face, difficulty swallowing or breathing, return to the ER immediately for a recheck.   Dental Abscess A dental abscess is a collection of pus in or around a tooth. CAUSES This condition is caused by a bacterial infection around the root of the tooth that involves the inner part of the tooth (pulp). It may result from:  Severe tooth decay.  Trauma to the tooth that allows bacteria to enter into the pulp, such as a broken or chipped tooth.  Severe gum disease around a tooth. SYMPTOMS Symptoms of this condition include:  Severe pain in and around the infected tooth.  Swelling and redness around the infected tooth, in the mouth, or in the face.  Tenderness.  Pus drainage.  Bad breath.  Bitter taste in the mouth.  Difficulty swallowing.  Difficulty opening the mouth.  Nausea.  Vomiting.  Chills.  Swollen neck glands.  Fever. DIAGNOSIS This condition is diagnosed with examination of the infected tooth. During the exam, your dentist may tap on the infected tooth. Your dentist will also ask about your medical and dental history and may order X-rays. TREATMENT This condition is treated by eliminating the infection. This may be done with:  Antibiotic medicine.  A root canal. This may be performed to save the tooth.  Pulling (extracting) the tooth. This may also involve draining the  abscess. This is done if the tooth cannot be saved. HOME CARE INSTRUCTIONS  Take medicines only as directed by your dentist.  If you were prescribed antibiotic medicine, finish all of it even if you start to feel better.  Rinse your mouth (gargle) often with salt water to relieve pain or swelling.  Do not drive or operate heavy machinery while taking pain medicine.  Do not apply heat to the outside of your mouth.  Keep all follow-up visits as directed by your dentist. This is important. SEEK MEDICAL CARE IF:  Your pain is worse and is not helped by medicine. SEEK IMMEDIATE MEDICAL CARE IF:  You have a fever or chills.  Your symptoms suddenly get worse.  You have a very bad headache.  You have problems breathing or swallowing.  You have trouble opening your mouth.  You have swelling in your neck or around your eye.   This information is not intended to replace advice given to you by your health care provider. Make sure you discuss any questions you have with your health care provider.   Document Released: 09/13/2005 Document Revised: 01/28/2015 Document Reviewed: 09/10/2014 Elsevier Interactive Patient Education [employer]2016 [employer] Inc.

## 2015-09-24 NOTE — ED Notes (Signed)
PA at bedside.
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
## 2016-03-02 ENCOUNTER — Emergency Department (HOSPITAL_BASED_OUTPATIENT_CLINIC_OR_DEPARTMENT_OTHER)
Admission: EM | Admit: 2016-03-02 | Discharge: 2016-03-02 | Disposition: A | Discharge status: Home / Self Care | Payer: No Typology Code available for payment source | Attending: Emergency Medicine | Admitting: Emergency Medicine

## 2016-03-02 ENCOUNTER — Encounter (HOSPITAL_BASED_OUTPATIENT_CLINIC_OR_DEPARTMENT_OTHER): Payer: Self-pay | Admitting: Emergency Medicine

## 2016-03-02 DIAGNOSIS — N9089 Other specified noninflammatory disorders of vulva and perineum: Secondary | ICD-10-CM | POA: Insufficient documentation

## 2016-03-02 DIAGNOSIS — Z87891 Personal history of nicotine dependence: Secondary | ICD-10-CM | POA: Insufficient documentation

## 2016-03-02 DIAGNOSIS — J45909 Unspecified asthma, uncomplicated: Secondary | ICD-10-CM | POA: Insufficient documentation

## 2016-03-02 NOTE — Discharge Instructions (Signed)
You have a superficial tear at the base of your vagina from sexual intercourse.   These cuts will heal up by themselves over the next 5-7 days. Avoid irritation to this area, including sexual intercourse until the skin as healed.  Apply aloe or other soothing lotion to the area as needed. Take motrin and tylenol for pain.   Return for worsening symptoms, including abnormal vaginal discharge, worsening swelling or pain, or any other symptoms concerning to you.

## 2016-03-02 NOTE — ED Notes (Signed)
Pain between the vagina and rectum since two days ago.  Pt states some swelling.  No drainage.  Pain worse after intercourse.

## 2016-03-02 NOTE — ED Provider Notes (Signed)
CSN: [id number]     Arrival date & time 03/02/16  1001 History   First MD Initiated Contact with Patient 03/02/16 1015     Chief Complaint  Patient presents with  . Vaginal Pain     (Consider location/radiation/quality/duration/timing/severity/associated sxs/prior Treatment) HPI 25 year old female who presents with vaginal pain. States that after sexual intercourse 2 days ago, she noticed a pain at the base of her vagina. She did not notice significant bleeding but did had some mild soft tissue swelling of that area. She has not had any  abnormal vaginal discharge or vaginal bleeding. No fevers or chills, dysuria, urinary frequency. Had normal bowel movement without pain with defecation. Denies fever chills, abdominal pain. Does not think she had any trauma during sexual intercourse   Past Medical History  Diagnosis Date  . Infection 2012    GC, chlamydia  . Asthma    Past Surgical History  Procedure Laterality Date  . Knee surgery      left   Family History  Problem Relation Age of Onset  . Heart disease Father    Social History  Substance Use Topics  . Smoking status: Former Smoker -- 0.25 packs/day for 2 years    Types: Cigarettes  . Smokeless tobacco: Never Used  . Alcohol Use: No   OB History    Gravida Para Term Preterm AB TAB SAB Ectopic Multiple Living   0              Review of Systems 10/14 systems reviewed and are negative other than those stated in the HPI    Allergies  Amoxicillin and Penicillins  Home Medications   Prior to Admission medications   Medication Sig Start Date End Date Taking? Authorizing Provider  clindamycin (CLEOCIN) 300 MG capsule Take 1 capsule (300 mg total) by mouth 4 (four) times daily. X 10 days 09/24/15   Trixie DredgeEmily West, PA-C  ibuprofen (ADVIL,MOTRIN) 800 MG tablet Take 1 tablet (800 mg total) by mouth every 8 (eight) hours as needed for mild pain or moderate pain. 09/24/15   Trixie DredgeEmily West, PA-C  oxyCODONE-acetaminophen  (PERCOCET/ROXICET) 5-325 MG tablet Take 1-2 tablets by mouth every 4 (four) hours as needed for moderate pain or severe pain. 09/24/15   Trixie DredgeEmily West, PA-C   BP 129/84 mmHg  Pulse 102  Temp(Src) 98.4 F (36.9 C) (Oral)  Resp 18  Ht 5\' 2"  (1.575 m)  Wt 135 lb (61.236 kg)  BMI 24.69 kg/m2  SpO2 99%  LMP 02/04/2016 Physical Exam Physical Exam  Nursing note and vitals reviewed. Constitutional: Well developed, well nourished, non-toxic, and in no acute distress Head: Normocephalic and atraumatic.  Mouth/Throat: Mucous membranes moist Neck: Normal range of motion.  Cardiovascular: Normal rate and regular rhythm.   Pulmonary/Chest: Effort normal  Abdominal: Soft. There is no tenderness. There is no rebound and no guarding.  GU: There is superficial 1.5 cm vertical fissure at the base of the introitus of the vagina. Musculoskeletal: Normal range of motion.  Neurological: Alert, no facial droop, fluent speech Skin: Skin is warm and dry.  Psychiatric: Cooperative  ED Course  Procedures (including critical care time) Labs Review Labs Reviewed - No data to display  Imaging Review No results found. I have personally reviewed and evaluated these images and lab results as part of my medical decision-making.   EKG Interpretation None      MDM   Final diagnoses:  Perineal fissure in female    25 year old female who presents with vaginal  pain after intercourse 2 days ago. Well-appearing in no acute distress with non-concerning vital signs. She has a soft and benign abdomen. On GU exam, she has a 1.5 cm superficial vertical fissure at the base of the introitus of the vagina. Likely incurred during sexual intercourse 2 days ago. Very superficial, not requiring repair. Discussed supportive management for this. No other concerns for discharge, STD/PID, or urinary complaints. She does not require  speculum exam or any other testing at this time. Strict return and follow-up instructions  reviewed. She expressed understanding of all discharge instructions and felt comfortable with the plan of care.   Lavera Guise, MD 03/02/16 1048

## 2016-03-03 ENCOUNTER — Emergency Department (HOSPITAL_BASED_OUTPATIENT_CLINIC_OR_DEPARTMENT_OTHER)
Admission: EM | Admit: 2016-03-03 | Discharge: 2016-03-04 | Disposition: A | Discharge status: Home / Self Care | Payer: No Typology Code available for payment source | Attending: Emergency Medicine | Admitting: Emergency Medicine

## 2016-03-03 ENCOUNTER — Encounter (HOSPITAL_BASED_OUTPATIENT_CLINIC_OR_DEPARTMENT_OTHER): Payer: Self-pay | Admitting: *Deleted

## 2016-03-03 DIAGNOSIS — Z87891 Personal history of nicotine dependence: Secondary | ICD-10-CM | POA: Insufficient documentation

## 2016-03-03 DIAGNOSIS — J45909 Unspecified asthma, uncomplicated: Secondary | ICD-10-CM | POA: Insufficient documentation

## 2016-03-03 DIAGNOSIS — S3141XD Laceration without foreign body of vagina and vulva, subsequent encounter: Secondary | ICD-10-CM | POA: Insufficient documentation

## 2016-03-03 DIAGNOSIS — X58XXXD Exposure to other specified factors, subsequent encounter: Secondary | ICD-10-CM | POA: Insufficient documentation

## 2016-03-03 NOTE — ED Notes (Signed)
Pt c/o increasing pain to vaginal area where she has a tear.  She has been taking the motrin as suggested, but the pain is increasing.  She denies any discharge, c/o burning in the same area when she urinates.  She has tried first aid cream and her symptoms are not relieved.

## 2016-03-03 NOTE — ED Notes (Signed)
Pt here for vaginal wound recheck, seen here yesterday for vaginal tear, increased pain and discomfort

## 2016-03-04 MED ORDER — LIDOCAINE HCL 2 % EX GEL
1.0000 "application " | Freq: Once | CUTANEOUS | Status: AC
  Administered 2016-03-04: 1 via TOPICAL

## 2016-03-04 MED ORDER — LIDOCAINE (ANORECTAL) 5 % EX CREA: TOPICAL_CREAM | CUTANEOUS | Status: DC

## 2016-03-04 MED ORDER — LIDOCAINE HCL 2 % EX GEL
CUTANEOUS | Status: DC
Start: 2016-03-04 — End: 2016-03-04
  Filled 2016-03-04: qty 20

## 2016-03-04 NOTE — ED Provider Notes (Signed)
CSN: [id number]     Arrival date & time 03/03/16  2311 History   First MD Initiated Contact with Patient 03/04/16 0005     Chief Complaint  Patient presents with  . Wound Check     (Consider location/radiation/quality/duration/timing/severity/associated sxs/prior Treatment) HPI  This is a 25 year old female who tore the posterior aspect of her vulva about 4 days ago. She has been having severe pain at the site. She was here 2 days ago and prescribed ibuprofen which has not adequately controlled the pain. The pain is severe and worse with movement or urination. There is no associated bleeding.  Past Medical History  Diagnosis Date  . Infection 2012    GC, chlamydia  . Asthma    Past Surgical History  Procedure Laterality Date  . Knee surgery      left   Family History  Problem Relation Age of Onset  . Heart disease Father    Social History  Substance Use Topics  . Smoking status: Former Smoker -- 0.25 packs/day for 2 years    Types: Cigarettes  . Smokeless tobacco: Never Used  . Alcohol Use: No   OB History    Gravida Para Term Preterm AB TAB SAB Ectopic Multiple Living   0              Review of Systems  All other systems reviewed and are negative.   Allergies  Amoxicillin and Penicillins  Home Medications   Prior to Admission medications   Medication Sig Start Date End Date Taking? Authorizing Provider  clindamycin (CLEOCIN) 300 MG capsule Take 1 capsule (300 mg total) by mouth 4 (four) times daily. X 10 days 09/24/15   Trixie DredgeEmily West, PA-C  ibuprofen (ADVIL,MOTRIN) 800 MG tablet Take 1 tablet (800 mg total) by mouth every 8 (eight) hours as needed for mild pain or moderate pain. 09/24/15   Trixie DredgeEmily West, PA-C  oxyCODONE-acetaminophen (PERCOCET/ROXICET) 5-325 MG tablet Take 1-2 tablets by mouth every 4 (four) hours as needed for moderate pain or severe pain. 09/24/15   Trixie DredgeEmily West, PA-C   BP 120/75 mmHg  Pulse 113  Temp(Src) 98.6 F (37 C) (Oral)  Resp 18  Ht 5\' 2"   (1.575 m)  Wt 135 lb (61.236 kg)  BMI 24.69 kg/m2  SpO2 100%  LMP 02/04/2016   Physical Exam  General: Well-developed, well-nourished female in no acute distress; appearance consistent with age of record HENT: normocephalic; atraumatic Eyes: pupils equal, round and reactive to light; extraocular muscles intact Neck: supple Heart: regular rate and rhythm; no murmurs, rubs or gallops Lungs: clear to auscultation bilaterally Abdomen: soft; nondistended; nontender; bowel sounds present GU: Tear of the posterior midline aspect of the vulva, profoundly tender to palpation Extremities: No deformity; full range of motion; pulses normal Neurologic: Awake, alert and oriented; motor function intact in all extremities and symmetric; no facial droop Skin: Warm and dry Psychiatric: Tearful    ED Course  Procedures (including critical care time)   MDM  We'll prescribe 5% lidocaine cream for symptomatic relief.     Paula LibraJohn Lincoln Kleiner, MD 03/04/16 [id number]

## 2016-03-05 ENCOUNTER — Encounter (HOSPITAL_COMMUNITY): Payer: Self-pay | Admitting: Emergency Medicine

## 2016-03-05 ENCOUNTER — Emergency Department (HOSPITAL_COMMUNITY)
Admission: EM | Admit: 2016-03-05 | Discharge: 2016-03-05 | Disposition: A | Discharge status: Home / Self Care | Payer: No Typology Code available for payment source | Attending: Emergency Medicine | Admitting: Emergency Medicine

## 2016-03-05 DIAGNOSIS — Z791 Long term (current) use of non-steroidal anti-inflammatories (NSAID): Secondary | ICD-10-CM | POA: Insufficient documentation

## 2016-03-05 DIAGNOSIS — Z792 Long term (current) use of antibiotics: Secondary | ICD-10-CM | POA: Insufficient documentation

## 2016-03-05 DIAGNOSIS — N9089 Other specified noninflammatory disorders of vulva and perineum: Secondary | ICD-10-CM | POA: Insufficient documentation

## 2016-03-05 DIAGNOSIS — Z87891 Personal history of nicotine dependence: Secondary | ICD-10-CM | POA: Insufficient documentation

## 2016-03-05 DIAGNOSIS — J45909 Unspecified asthma, uncomplicated: Secondary | ICD-10-CM | POA: Insufficient documentation

## 2016-03-05 DIAGNOSIS — N390 Urinary tract infection, site not specified: Secondary | ICD-10-CM | POA: Insufficient documentation

## 2016-03-05 LAB — URINALYSIS, ROUTINE W REFLEX MICROSCOPIC
Glucose, UA: NEGATIVE mg/dL
KETONES UR: 40 mg/dL — AB
NITRITE: POSITIVE — AB
Specific Gravity, Urine: 1.029 (ref 1.005–1.030)
pH: 5.5 (ref 5.0–8.0)

## 2016-03-05 LAB — URINE MICROSCOPIC-ADD ON

## 2016-03-05 LAB — PREGNANCY, URINE: Preg Test, Ur: NEGATIVE

## 2016-03-05 MED ORDER — HYDROCODONE-ACETAMINOPHEN 5-325 MG PO TABS
1.0000 | ORAL_TABLET | Freq: Once | ORAL | Status: AC
  Administered 2016-03-05: 1 via ORAL
  Filled 2016-03-05: qty 1

## 2016-03-05 MED ORDER — HYDROCODONE-ACETAMINOPHEN 5-325 MG PO TABS: 1.0000 | ORAL_TABLET | Freq: Four times a day (QID) | ORAL | Status: DC | PRN

## 2016-03-05 MED ORDER — NITROFURANTOIN MONOHYD MACRO 100 MG PO CAPS: 100.0000 mg | ORAL_CAPSULE | Freq: Two times a day (BID) | ORAL | Status: DC

## 2016-03-05 MED ORDER — TRIAMCINOLONE ACETONIDE 0.025 % EX OINT: 1.0000 "application " | TOPICAL_OINTMENT | Freq: Two times a day (BID) | CUTANEOUS | Status: DC

## 2016-03-05 MED ORDER — ACETAMINOPHEN 500 MG PO TABS: 500.0000 mg | ORAL_TABLET | Freq: Four times a day (QID) | ORAL | Status: DC | PRN

## 2016-03-05 MED ORDER — POLYETHYLENE GLYCOL 3350 17 GM/SCOOP PO POWD: 17.0000 g | Freq: Once | ORAL | Status: DC

## 2016-03-05 NOTE — ED Notes (Signed)
EDP at bedside  

## 2016-03-05 NOTE — ED Notes (Signed)
Was told she had a fissure in her bottom and she has seen dr but it hurts to bad

## 2016-03-05 NOTE — Discharge Instructions (Signed)
Take antibiotics as prescribed. Use steroid cream and lidocaine gel as needed for fissure. Follow-up closely with women's clinic listed above for re-check.  Return for worsening symptoms, including fever, severe abdominal or back pain, vomiting and unable to keep down food/fluids, or any other symptoms concerning to you.  Urinary Tract Infection Urinary tract infections (UTIs) can develop anywhere along your urinary tract. Your urinary tract is your body's drainage system for removing wastes and extra water. Your urinary tract includes two kidneys, two ureters, a bladder, and a urethra. Your kidneys are a pair of bean-shaped organs. Each kidney is about the size of your fist. They are located below your ribs, one on each side of your spine. CAUSES Infections are caused by microbes, which are microscopic organisms, including fungi, viruses, and bacteria. These organisms are so small that they can only be seen through a microscope. Bacteria are the microbes that most commonly cause UTIs. SYMPTOMS  Symptoms of UTIs may vary by age and gender of the patient and by the location of the infection. Symptoms in young women typically include a frequent and intense urge to urinate and a painful, burning feeling in the bladder or urethra during urination. Older women and men are more likely to be tired, shaky, and weak and have muscle aches and abdominal pain. A fever may mean the infection is in your kidneys. Other symptoms of a kidney infection include pain in your back or sides below the ribs, nausea, and vomiting. DIAGNOSIS To diagnose a UTI, your caregiver will ask you about your symptoms. Your caregiver will also ask you to provide a urine sample. The urine sample will be tested for bacteria and white blood cells. White blood cells are made by your body to help fight infection. TREATMENT  Typically, UTIs can be treated with medication. Because most UTIs are caused by a bacterial infection, they usually can be  treated with the use of antibiotics. The choice of antibiotic and length of treatment depend on your symptoms and the type of bacteria causing your infection. HOME CARE INSTRUCTIONS  If you were prescribed antibiotics, take them exactly as your caregiver instructs you. Finish the medication even if you feel better after you have only taken some of the medication.  Drink enough water and fluids to keep your urine clear or pale yellow.  Avoid caffeine, tea, and carbonated beverages. They tend to irritate your bladder.  Empty your bladder often. Avoid holding urine for long periods of time.  Empty your bladder before and after sexual intercourse.  After a bowel movement, women should cleanse from front to back. Use each tissue only once. SEEK MEDICAL CARE IF:   You have back pain.  You develop a fever.  Your symptoms do not begin to resolve within 3 days. SEEK IMMEDIATE MEDICAL CARE IF:   You have severe back pain or lower abdominal pain.  You develop chills.  You have nausea or vomiting.  You have continued burning or discomfort with urination. MAKE SURE YOU:   Understand these instructions.  Will watch your condition.  Will get help right away if you are not doing well or get worse.   This information is not intended to replace advice given to you by your health care provider. Make sure you discuss any questions you have with your health care provider.   Document Released: 06/23/2005 Document Revised: 06/04/2015 Document Reviewed: 10/22/2011 Elsevier Interactive Patient Education [employer]2016 [employer] Inc.

## 2016-03-05 NOTE — ED Provider Notes (Signed)
CSN: [id number]     Arrival date & time 03/05/16  4098 History   First MD Initiated Contact with Patient 03/05/16 1005     Chief Complaint  Patient presents with  . Rectal Pain     (Consider location/radiation/quality/duration/timing/severity/associated sxs/prior Treatment) HPI  25 year old female who presents with vaginal fissure. Seen in ED 2 days ago for vaginal fissure incurred during sexual intercourse 1 week ago. Feels fissure is enlarging and having worsening pain despite lidocaine jelly and moisturizers. C/o dysuria outside of burning after urination. No vaginal bleeding or discharge. No fever, or chills. No difficulty with defecation.   Past Medical History  Diagnosis Date  . Infection 2012    GC, chlamydia  . Asthma    Past Surgical History  Procedure Laterality Date  . Knee surgery      left   Family History  Problem Relation Age of Onset  . Heart disease Father    Social History  Substance Use Topics  . Smoking status: Former Smoker -- 0.25 packs/day for 2 years    Types: Cigarettes  . Smokeless tobacco: Never Used  . Alcohol Use: No   OB History    Gravida Para Term Preterm AB TAB SAB Ectopic Multiple Living   0              Review of Systems 10/14 systems reviewed and are negative other than those stated in the HPI    Allergies  Amoxicillin and Penicillins  Home Medications   Prior to Admission medications   Medication Sig Start Date End Date Taking? Authorizing Provider  acetaminophen (TYLENOL) 325 MG tablet Take 650 mg by mouth every 6 (six) hours as needed for mild pain.   Yes Historical Provider, MD  ibuprofen (ADVIL,MOTRIN) 800 MG tablet Take 1 tablet (800 mg total) by mouth every 8 (eight) hours as needed for mild pain or moderate pain. 09/24/15  Yes Trixie Dredge, PA-C  clindamycin (CLEOCIN) 300 MG capsule Take 1 capsule (300 mg total) by mouth 4 (four) times daily. X 10 days Patient not taking: Reported on 03/05/2016 09/24/15   Trixie Dredge, PA-C   HYDROcodone-acetaminophen (NORCO/VICODIN) 5-325 MG tablet Take 1 tablet by mouth every 6 (six) hours as needed for moderate pain or severe pain. 03/05/16   Lavera Guise, MD  Lidocaine, Anorectal, 5 % CREA Applied to site of injury as needed for pain. Patient not taking: Reported on 03/05/2016 03/04/16   Paula Libra, MD  nitrofurantoin, macrocrystal-monohydrate, (MACROBID) 100 MG capsule Take 1 capsule (100 mg total) by mouth 2 (two) times daily. 03/05/16   Lavera Guise, MD  oxyCODONE-acetaminophen (PERCOCET/ROXICET) 5-325 MG tablet Take 1-2 tablets by mouth every 4 (four) hours as needed for moderate pain or severe pain. Patient not taking: Reported on 03/05/2016 09/24/15   Trixie Dredge, PA-C  triamcinolone (KENALOG) 0.025 % ointment Apply 1 application topically 2 (two) times daily. 03/05/16   Lavera Guise, MD   BP 117/75 mmHg  Pulse 99  Temp(Src) 99.3 F (37.4 C) (Oral)  Resp 20  SpO2 100%  LMP 02/04/2016 Physical Exam Physical Exam  Nursing note and vitals reviewed. Constitutional: Well developed, well nourished, non-toxic, and in no acute distress Head: Normocephalic and atraumatic.  Mouth/Throat: Oropharynx is clear and moist.  Neck: Normal range of motion. Neck supple.  Cardiovascular: Normal rate and regular rhythm.   Pulmonary/Chest: Effort normal  Abdominal: Soft. There is no tenderness. There is no rebound and no guarding.  GU: Vaginal fissure/tear  to posterior fourchette of the vaginal. Tender to palpation with skin significant skin breakdown from moisture. Musculoskeletal: Normal range of motion.  Neurological: Alert, no facial droop, fluent speech, moves all extremities symmetrically Skin: Skin is warm and dry.  Psychiatric: Cooperative  ED Course  Procedures (including critical care time) Labs Review Labs Reviewed  URINALYSIS, ROUTINE W REFLEX MICROSCOPIC (NOT AT [facility]) - Abnormal; Notable for the following:    Color, Urine RED (*)    APPearance TURBID (*)    Hgb urine dipstick  LARGE (*)    Bilirubin Urine MODERATE (*)    Ketones, ur 40 (*)    Protein, ur >300 (*)    Nitrite POSITIVE (*)    Leukocytes, UA LARGE (*)    All other components within normal limits  URINE MICROSCOPIC-ADD ON - Abnormal; Notable for the following:    Squamous Epithelial / LPF TOO NUMEROUS TO COUNT (*)    Bacteria, UA MANY (*)    All other components within normal limits  URINE CULTURE  PREGNANCY, URINE    Imaging Review No results found. I have personally reviewed and evaluated these images and lab results as part of my medical decision-making.   EKG Interpretation None      MDM   Final diagnoses:  Perineal fissure in female  UTI (lower urinary tract infection)   25 year old who presents with persistent vaginal fissure after intercourse one week ago. On presentation she is nontoxic in no acute distress. Vital signs stable. On exam she does have persistent fissure over the posterior fourchette of the vagina, with some skin breakdown due to moisture. Given new urinary complaints, UA performed. Negative pregancy but with UTI. Sent for culture. W/o abd pain, flank pain or systemic symptoms Concerning for pyelonephritis. We'll treat with a course of Macrobid. She still has an ongoing vaginal fissure, and discuss continued supportive care and given triamcinolone cream. Given referral to women's clinic for close follow-up. Strict return and follow-up instructions are reviewed. She expressed understanding of all discharge instructions, and felt comfortable to plan of care.   Lavera Guiseana Duo Tommey Barret, MD 03/05/16 1345

## 2016-03-06 LAB — URINE CULTURE

## 2016-03-08 ENCOUNTER — Emergency Department (HOSPITAL_COMMUNITY)
Admission: EM | Admit: 2016-03-08 | Discharge: 2016-03-08 | Disposition: A | Discharge status: Home / Self Care | Payer: No Typology Code available for payment source | Attending: Emergency Medicine | Admitting: Emergency Medicine

## 2016-03-08 ENCOUNTER — Encounter (HOSPITAL_COMMUNITY): Payer: Self-pay | Admitting: Emergency Medicine

## 2016-03-08 DIAGNOSIS — J45909 Unspecified asthma, uncomplicated: Secondary | ICD-10-CM | POA: Insufficient documentation

## 2016-03-08 DIAGNOSIS — Z79899 Other long term (current) drug therapy: Secondary | ICD-10-CM | POA: Insufficient documentation

## 2016-03-08 DIAGNOSIS — Z87891 Personal history of nicotine dependence: Secondary | ICD-10-CM | POA: Insufficient documentation

## 2016-03-08 DIAGNOSIS — N9089 Other specified noninflammatory disorders of vulva and perineum: Secondary | ICD-10-CM | POA: Insufficient documentation

## 2016-03-08 MED ORDER — KETOROLAC TROMETHAMINE 30 MG/ML IJ SOLN
30.0000 mg | Freq: Once | INTRAMUSCULAR | Status: AC
  Administered 2016-03-08: 30 mg via INTRAMUSCULAR
  Filled 2016-03-08: qty 1

## 2016-03-08 MED ORDER — OXYCODONE-ACETAMINOPHEN 5-325 MG PO TABS: 1.0000 | ORAL_TABLET | Freq: Four times a day (QID) | ORAL | Status: DC | PRN

## 2016-03-08 MED ORDER — OXYCODONE-ACETAMINOPHEN 5-325 MG PO TABS
1.0000 | ORAL_TABLET | Freq: Once | ORAL | Status: AC
  Administered 2016-03-08: 1 via ORAL
  Filled 2016-03-08: qty 1

## 2016-03-08 NOTE — ED Notes (Signed)
Dr. Horton at bedside. 

## 2016-03-08 NOTE — ED Notes (Signed)
Pt ambulated to and from restroom without difficulty.   

## 2016-03-08 NOTE — ED Provider Notes (Addendum)
CSN: [id number]     Arrival date & time 03/08/16  0009 History  By signing my name below, I, Arianna Nassar, attest that this documentation has been prepared under the direction and in the presence of Shon Baton, MD.  Electronically Signed: Octavia Heir, ED Scribe. 03/08/2016. 3:50 AM.    Chief Complaint  Patient presents with  . Vaginal Pain      The history is provided by the patient. No language interpreter was used.   HPI Comments: Lisa Bond is a 25 y.o. female who presents to the Emergency Department complaining of a constant, gradual worsening, moderate, 10/10 perineal fissure onset three days ago. She has associated vaginal discharge and burning with urination. Pt has been seen four times since the onset and notes her pain has not gotten any better. Pt has a perineal fissure received after taking a bowel movement and has gradually gotten worse after having sexual intercourse.  Pt says that it causes her increased pain when urinating, having bowel movements, and anything that touches around the area. Pt has been taking hydrocodone to alleviate her pain with no relief. She has been using her lidocaine cream to alleviate her pain with short lived relief. She denies abdominal pain.   Past Medical History  Diagnosis Date  . Infection 2012    GC, chlamydia  . Asthma    Past Surgical History  Procedure Laterality Date  . Knee surgery      left   Family History  Problem Relation Age of Onset  . Heart disease Father    Social History  Substance Use Topics  . Smoking status: Former Smoker -- 0.25 packs/day for 2 years    Types: Cigarettes  . Smokeless tobacco: Never Used  . Alcohol Use: No   OB History    Gravida Para Term Preterm AB TAB SAB Ectopic Multiple Living   0              Review of Systems  Gastrointestinal: Negative for abdominal pain.  Genitourinary: Positive for vaginal discharge and vaginal pain.  All other systems reviewed and are  negative.     Allergies  Amoxicillin and Penicillins  Home Medications   Prior to Admission medications   Medication Sig Start Date End Date Taking? Authorizing Provider  acetaminophen (TYLENOL) 325 MG tablet Take 650 mg by mouth every 6 (six) hours as needed for mild pain.   Yes Historical Provider, MD  acetaminophen (TYLENOL) 500 MG tablet Take 1 tablet (500 mg total) by mouth every 6 (six) hours as needed for mild pain or moderate pain. 03/05/16  Yes Lavera Guise, MD  HYDROcodone-acetaminophen (NORCO/VICODIN) 5-325 MG tablet Take 1 tablet by mouth every 6 (six) hours as needed for moderate pain or severe pain. 03/05/16  Yes Lavera Guise, MD  ibuprofen (ADVIL,MOTRIN) 800 MG tablet Take 1 tablet (800 mg total) by mouth every 8 (eight) hours as needed for mild pain or moderate pain. 09/24/15  Yes Trixie Dredge, PA-C  Lidocaine, Anorectal, 5 % CREA Applied to site of injury as needed for pain. 03/04/16  Yes John Molpus, MD  nitrofurantoin, macrocrystal-monohydrate, (MACROBID) 100 MG capsule Take 1 capsule (100 mg total) by mouth 2 (two) times daily. 03/05/16  Yes Lavera Guise, MD  triamcinolone (KENALOG) 0.025 % ointment Apply 1 application topically 2 (two) times daily. 03/05/16  Yes Lavera Guise, MD   Triage vitals: BP 161/86 mmHg  Pulse 95  Temp(Src) 98.8 F (37.1 C) (Oral)  Resp 20  Ht 5\' 2"  (1.575 m)  Wt 134 lb 2 oz (60.839 kg)  BMI 24.53 kg/m2  SpO2 100%  LMP 02/26/2016 Physical Exam  Constitutional: She is oriented to person, place, and time. She appears well-developed and well-nourished.  HENT:  Head: Normocephalic and atraumatic.  Cardiovascular: Normal rate and regular rhythm.   Pulmonary/Chest: Effort normal. No respiratory distress.  Genitourinary:  2 cm laceration extending vertically from the posterior fourchette, no significant adjacent erythema  Neurological: She is alert and oriented to person, place, and time.  Skin: Skin is warm and dry.  Psychiatric: She has a normal  mood and affect.  Nursing note and vitals reviewed.   ED Course  Procedures  DIAGNOSTIC STUDIES: Oxygen Saturation is 100% on RA, normal by my interpretation.  COORDINATION OF CARE:  3:50 AM Discussed treatment plan with pt at bedside and pt agreed to plan. Consult with Ob/Gyn on call.  Labs Review Labs Reviewed - No data to display  Imaging Review No results found. I have personally reviewed and evaluated these images and lab results as part of my medical decision-making.   EKG Interpretation None      MDM   Final diagnoses:  Perineal fissure in female    Patient presents with persistent pain. Reports pain is refractory to all prior prescribed regimens. She is otherwise nontoxic on exam. 2 cm fissure noted at the posterior fourchette. Patient was given oxycodone. I discussed with OB/GYN on call. They also recommend pericare wash when patient urinates to decrease pain. I have stressed that the patient that this will take time. Continue lidocaine cream. Avoid constipation. She can apply ice indirectly if that helps. [facility]  After history, exam, and medical workup I feel the patient has been appropriately medically screened and is safe for discharge home. Pertinent diagnoses were discussed with the patient. Patient was given return precautions.  I personally performed the services described in this documentation, which was scribed in my presence. The recorded information has been reviewed and is accurate.    Shon Batonourtney F Mccabe Gloria, MD 03/08/16 [phone]  I was called back into the patient's room. She is requesting stronger pain medication for home. She did have better relief with oxycodone in the ER. I again asked discussed at length with the patient that she needs to concentrate on local care including lidocaine jelly, sitz baths, and perineal wash. She needs to avoid constipation. She will be given a very short course of oxycodone for pain refractory to these  treatments. She stated multiple times that "I'll just come back if not pain is not improved." I discussed with her that she needs to follow-up with women's clinic. We are maximizing her supportive efforts at this time.  Shon Batonourtney F Tania Perrott, MD 03/08/16 [phone]  Shon Batonourtney F Karanveer Ramakrishnan, MD 03/08/16 [phone]

## 2016-03-08 NOTE — ED Notes (Signed)
Pt. reports vaginal pain from her perineal fissure unrelieved by prescription medications .

## 2016-03-08 NOTE — Discharge Instructions (Signed)
Vaginal Fissure You were seen again today for a vaginal fissure. Continue sitz baths and lidocaine cream at home. You also need to avoid constipation and use perineal wash after using the restroom. Follow-up with women's clinic. This will take time to heal.  TREATMENT Treatment for this condition may include:  Taking steps to avoid constipation. This may include making changes to your diet, such as increasing your intake of fiber or fluid.  Taking fiber supplements. These supplements can soften your stool to help make bowel movements easier. Your health care provider may also prescribe a stool softener if your stool is often hard.  Taking sitz baths. This may help to heal the tear.  Using medicated creams or ointments. These may be prescribed to lessen discomfort. HOME CARE INSTRUCTIONS Eating and Drinking  Avoid foods that may be constipating, such as bananas and dairy products.  Drink enough fluid to keep your urine clear or pale yellow.  Maintain a diet that is high in fruits, whole grains, and vegetables. General Instructions  Keep the anal area as clean and dry as possible.  Take sitz baths as told by your health care provider. Do not use soap in the sitz baths.  Take over-the-counter and prescription medicines only as told by your health care provider.  Use creams or ointments only as told by your health care provider.  Keep all follow-up visits as told by your health care provider. This is important. SEEK MEDICAL CARE IF:  You have more bleeding.  You have a fever.  You have diarrhea that is mixed with blood.  You continue to have pain.  Your problem is getting worse rather than better.   This information is not intended to replace advice given to you by your health care provider. Make sure you discuss any questions you have with your health care provider.   Document Released: 09/13/2005 Document Revised: 06/04/2015 Document Reviewed: 12/09/2014 Elsevier  Interactive Patient Education [employer]

## 2016-03-08 NOTE — ED Notes (Signed)
Pt given underpants, pads, and paper scrub pants to wear home.

## 2016-08-03 ENCOUNTER — Encounter (HOSPITAL_BASED_OUTPATIENT_CLINIC_OR_DEPARTMENT_OTHER): Payer: Self-pay | Admitting: *Deleted

## 2016-08-03 ENCOUNTER — Emergency Department (HOSPITAL_BASED_OUTPATIENT_CLINIC_OR_DEPARTMENT_OTHER)
Admission: EM | Admit: 2016-08-03 | Discharge: 2016-08-03 | Disposition: A | Discharge status: Home / Self Care | Payer: Medicaid Other | Attending: Emergency Medicine | Admitting: Emergency Medicine

## 2016-08-03 DIAGNOSIS — Z3A08 8 weeks gestation of pregnancy: Secondary | ICD-10-CM | POA: Insufficient documentation

## 2016-08-03 DIAGNOSIS — Z3490 Encounter for supervision of normal pregnancy, unspecified, unspecified trimester: Secondary | ICD-10-CM

## 2016-08-03 DIAGNOSIS — Z87891 Personal history of nicotine dependence: Secondary | ICD-10-CM | POA: Diagnosis not present

## 2016-08-03 DIAGNOSIS — J45909 Unspecified asthma, uncomplicated: Secondary | ICD-10-CM | POA: Diagnosis not present

## 2016-08-03 DIAGNOSIS — O219 Vomiting of pregnancy, unspecified: Secondary | ICD-10-CM | POA: Insufficient documentation

## 2016-08-03 LAB — URINALYSIS, ROUTINE W REFLEX MICROSCOPIC
BILIRUBIN URINE: NEGATIVE
Glucose, UA: NEGATIVE mg/dL
Hgb urine dipstick: NEGATIVE
KETONES UR: NEGATIVE mg/dL
Leukocytes, UA: NEGATIVE
NITRITE: NEGATIVE
PROTEIN: NEGATIVE mg/dL
Specific Gravity, Urine: 1.023 (ref 1.005–1.030)
pH: 6 (ref 5.0–8.0)

## 2016-08-03 LAB — PREGNANCY, URINE: PREG TEST UR: POSITIVE — AB

## 2016-08-03 MED ORDER — METOCLOPRAMIDE HCL 10 MG PO TABS: 10.0000 mg | ORAL_TABLET | Freq: Three times a day (TID) | ORAL | 0 refills | Status: DC | PRN

## 2016-08-03 NOTE — ED Triage Notes (Signed)
Vomiting x 2 days. She may be pregnant.

## 2016-08-03 NOTE — ED Notes (Signed)
Pt. Has no c/o pain in abd. And no reports of vaginal discharge.

## 2016-08-03 NOTE — ED Notes (Signed)
ED Provider at bedside. 

## 2016-08-03 NOTE — ED Notes (Signed)
Encouraged pt. To go to OBGYN for further treatment and follow up care.

## 2016-08-03 NOTE — ED Provider Notes (Signed)
[facility] Provider Note   CSN: [id number] Arrival date & time: 08/03/16  1238     History   Chief Complaint Chief Complaint  Patient presents with  . Emesis    HPI Lisa Bond is a 25 y.o. female.  HPI Lisa DrownKeyona M Bond is a 25 y.o. female presents emergency department complaining of nausea and vomiting for 4 weeks. Patient states that her nausea and vomiting is worse after eating. She denies any associated abdominal pain. She denies any fever or chills. No diarrhea. Denies any urinary symptoms. She states she missed a period last month. Denies any prior pregnancies. Denies any vaginal discharge or bleeding. She has not tried any medications to help her with her symptoms. No prior abdominal surgeries.  Past Medical History:  Diagnosis Date  . Asthma   . Infection 2012   GC, chlamydia    There are no active problems to display for this patient.   Past Surgical History:  Procedure Laterality Date  . KNEE SURGERY     left    OB History    Gravida Para Term Preterm AB Living   0             SAB TAB Ectopic Multiple Live Births                   Home Medications    Prior to Admission medications   Medication Sig Start Date End Date Taking? Authorizing Provider  acetaminophen (TYLENOL) 325 MG tablet Take 650 mg by mouth every 6 (six) hours as needed for mild pain.    Historical Provider, MD  acetaminophen (TYLENOL) 500 MG tablet Take 1 tablet (500 mg total) by mouth every 6 (six) hours as needed for mild pain or moderate pain. 03/05/16   Lavera Guiseana Duo Liu, MD  HYDROcodone-acetaminophen (NORCO/VICODIN) 5-325 MG tablet Take 1 tablet by mouth every 6 (six) hours as needed for moderate pain or severe pain. 03/05/16   Lavera Guiseana Duo Liu, MD  ibuprofen (ADVIL,MOTRIN) 800 MG tablet Take 1 tablet (800 mg total) by mouth every 8 (eight) hours as needed for mild pain or moderate pain. 09/24/15   Trixie DredgeEmily West, PA-C  Lidocaine, Anorectal, 5 % CREA Applied to site of injury as needed  for pain. 03/04/16   John Molpus, MD  nitrofurantoin, macrocrystal-monohydrate, (MACROBID) 100 MG capsule Take 1 capsule (100 mg total) by mouth 2 (two) times daily. 03/05/16   Lavera Guiseana Duo Liu, MD  oxyCODONE-acetaminophen (PERCOCET/ROXICET) 5-325 MG tablet Take 1 tablet by mouth every 6 (six) hours as needed for severe pain. 03/08/16   Shon Batonourtney F Horton, MD  triamcinolone (KENALOG) 0.025 % ointment Apply 1 application topically 2 (two) times daily. 03/05/16   Lavera Guiseana Duo Liu, MD    Family History Family History  Problem Relation Age of Onset  . Heart disease Father     Social History Social History  Substance Use Topics  . Smoking status: Former Smoker    Packs/day: 0.25    Years: 2.00    Types: Cigarettes  . Smokeless tobacco: Never Used  . Alcohol use No     Allergies   Amoxicillin and Penicillins   Review of Systems Review of Systems  Constitutional: Negative for chills and fever.  Respiratory: Negative for cough, chest tightness and shortness of breath.   Cardiovascular: Negative for chest pain, palpitations and leg swelling.  Gastrointestinal: Positive for nausea and vomiting. Negative for abdominal pain and diarrhea.  Genitourinary: Negative for dysuria, flank pain, pelvic  pain, vaginal bleeding, vaginal discharge and vaginal pain.  Musculoskeletal: Negative for arthralgias, myalgias, neck pain and neck stiffness.  Skin: Negative for rash.  Neurological: Negative for dizziness, weakness and headaches.  All other systems reviewed and are negative.    Physical Exam Updated Vital Signs BP 125/69   Pulse 73   Temp 97.9 F (36.6 C) (Oral)   Resp 20   Ht 5\' 2"  (1.575 m)   Wt 63.5 kg   LMP 06/03/2016   SpO2 100%   BMI 25.61 kg/m   Physical Exam  Constitutional: She appears well-developed and well-nourished. No distress.  HENT:  Head: Normocephalic.  Eyes: Conjunctivae are normal.  Neck: Neck supple.  Cardiovascular: Normal rate, regular rhythm and normal heart sounds.    Pulmonary/Chest: Effort normal and breath sounds normal. No respiratory distress. She has no wheezes. She has no rales.  Abdominal: Soft. Bowel sounds are normal. She exhibits no distension. There is no tenderness. There is no rebound.  Musculoskeletal: She exhibits no edema.  Neurological: She is alert.  Skin: Skin is warm and dry.  Psychiatric: She has a normal mood and affect. Her behavior is normal.  Nursing note and vitals reviewed.    ED Treatments / Results  Labs (all labs ordered are listed, but only abnormal results are displayed) Labs Reviewed  PREGNANCY, URINE - Abnormal; Notable for the following:       Result Value   Preg Test, Ur POSITIVE (*)    All other components within normal limits  URINALYSIS, ROUTINE W REFLEX MICROSCOPIC (NOT AT [facility])    EKG  EKG Interpretation None       Radiology No results found.  Procedures Procedures (including critical care time)  Medications Ordered in ED Medications - No data to display   Initial Impression / Assessment and Plan / ED Course  I have reviewed the triage vital signs and the nursing notes.  Pertinent labs & imaging results that were available during my care of the patient were reviewed by me and considered in my medical decision making (see chart for details).  Clinical Course    Patient is emergency department with nausea and vomiting for 4 weeks. No abdominal pain. No vaginal discharge or bleeding. No urinary symptoms. No fever or chills. No diarrhea. Patient is a test was positive. Patient again denies abdominal pain, no vaginal discharge or bleeding. Bedside ultrasound confirmed intrauterine pregnancy. Given patient has no symptoms, no further treatment indicated. Advised to try ginger for nausea and vomiting. Will give prescription for 10 Reglan to take only as needed. She will need to follow with OB/GYN. Will give referral to the [facility] clinic. Advised to start prenatal vitamins. Patient agrees  to plan and voices understanding of all the discharge instructions  Vitals:   08/03/16 1243 08/03/16 1245  BP:  125/69  Pulse:  73  Resp:  20  Temp:  97.9 F (36.6 C)  TempSrc:  Oral  SpO2:  100%  Weight: 63.5 kg   Height: 5\' 2"  (1.575 m)      Final Clinical Impressions(s) / ED Diagnoses   Final diagnoses:  Pregnancy, unspecified gestational age  Nausea and vomiting in pregnancy    New Prescriptions New Prescriptions   No medications on file     Jaynie Crumbleatyana Teauna Dubach, PA-C 08/03/16 1542    Lyndal Pulleyaniel Knott, MD 08/03/16 1636

## 2016-08-03 NOTE — Discharge Instructions (Signed)
Start prenatal vitamins daily. Tylenol for any pain. Try ginger for nausea and vomiting. Reglan for severe nausea and vomiting. Follow up with OB/GYN

## 2016-08-05 ENCOUNTER — Encounter (HOSPITAL_BASED_OUTPATIENT_CLINIC_OR_DEPARTMENT_OTHER): Payer: Self-pay | Admitting: *Deleted

## 2016-08-05 ENCOUNTER — Emergency Department (HOSPITAL_BASED_OUTPATIENT_CLINIC_OR_DEPARTMENT_OTHER): Payer: Medicaid Other

## 2016-08-05 ENCOUNTER — Emergency Department (HOSPITAL_BASED_OUTPATIENT_CLINIC_OR_DEPARTMENT_OTHER)
Admission: EM | Admit: 2016-08-05 | Discharge: 2016-08-05 | Disposition: A | Discharge status: Home / Self Care | Payer: Medicaid Other | Attending: Emergency Medicine | Admitting: Emergency Medicine

## 2016-08-05 DIAGNOSIS — O209 Hemorrhage in early pregnancy, unspecified: Secondary | ICD-10-CM | POA: Diagnosis present

## 2016-08-05 DIAGNOSIS — O200 Threatened abortion: Secondary | ICD-10-CM | POA: Insufficient documentation

## 2016-08-05 DIAGNOSIS — Z87891 Personal history of nicotine dependence: Secondary | ICD-10-CM | POA: Diagnosis not present

## 2016-08-05 DIAGNOSIS — Z3A08 8 weeks gestation of pregnancy: Secondary | ICD-10-CM | POA: Diagnosis not present

## 2016-08-05 DIAGNOSIS — J45909 Unspecified asthma, uncomplicated: Secondary | ICD-10-CM | POA: Insufficient documentation

## 2016-08-05 DIAGNOSIS — O208 Other hemorrhage in early pregnancy: Secondary | ICD-10-CM | POA: Diagnosis not present

## 2016-08-05 DIAGNOSIS — O468X1 Other antepartum hemorrhage, first trimester: Secondary | ICD-10-CM

## 2016-08-05 DIAGNOSIS — O418X10 Other specified disorders of amniotic fluid and membranes, first trimester, not applicable or unspecified: Secondary | ICD-10-CM

## 2016-08-05 LAB — ABO/RH: ABO/RH(D): O POS

## 2016-08-05 LAB — CBC WITH DIFFERENTIAL/PLATELET
BASOS ABS: 0 10*3/uL (ref 0.0–0.1)
BASOS PCT: 0 %
EOS ABS: 0.1 10*3/uL (ref 0.0–0.7)
Eosinophils Relative: 1 %
HCT: 38.1 % (ref 36.0–46.0)
HEMOGLOBIN: 13.2 g/dL (ref 12.0–15.0)
Lymphocytes Relative: 24 %
Lymphs Abs: 1.8 10*3/uL (ref 0.7–4.0)
MCH: 29.9 pg (ref 26.0–34.0)
MCHC: 34.6 g/dL (ref 30.0–36.0)
MCV: 86.2 fL (ref 78.0–100.0)
MONOS PCT: 9 %
Monocytes Absolute: 0.7 10*3/uL (ref 0.1–1.0)
NEUTROS PCT: 66 %
Neutro Abs: 4.8 10*3/uL (ref 1.7–7.7)
Platelets: 240 10*3/uL (ref 150–400)
RBC: 4.42 MIL/uL (ref 3.87–5.11)
RDW: 12.4 % (ref 11.5–15.5)
WBC: 7.3 10*3/uL (ref 4.0–10.5)

## 2016-08-05 LAB — WET PREP, GENITAL
Sperm: NONE SEEN
TRICH WET PREP: NONE SEEN
Yeast Wet Prep HPF POC: NONE SEEN

## 2016-08-05 LAB — HCG, QUANTITATIVE, PREGNANCY: HCG, BETA CHAIN, QUANT, S: 168237 m[IU]/mL — AB (ref <5)

## 2016-08-05 LAB — PREGNANCY, URINE: PREG TEST UR: POSITIVE — AB

## 2016-08-05 NOTE — Discharge Instructions (Signed)
No heavy lifting or strenuous activity until cleared by your obstetrician. Also, vaginal rest until cleared by your obstetrician. This means no sex, tampons, or other objects in the vagina.  Follow-up with OB/GYN in the next 3-7 days. [facility] been provided in this discharge summary for you to call and make these arrangements.

## 2016-08-05 NOTE — ED Triage Notes (Signed)
Pt reports pos preg test this week. Last "normal" period was in September. States she has been having some vaginal bleeding today, "a little more than spotting"

## 2016-08-05 NOTE — ED Provider Notes (Signed)
[facility]   CSN: [id number] Arrival date & time: 08/05/16  1347     History   Chief Complaint Chief Complaint  Patient presents with  . Vaginal Bleeding    HPI Lisa Bond is a 25 y.o. female.  Patient is a 25 year old female G1 who presents for evaluation of vaginal spotting. She just found out three days ago she is pregnant. She denies any abdominal pain. She denies any fevers, chills, or vaginal discharge. Her last definite menstrual period was in September but she has had intermittent irregular periods since then. She has had no prenatal care as of yet.   The history is provided by the patient.  Vaginal Bleeding  Primary symptoms include vaginal bleeding.  Primary symptoms include no discharge. There has been no fever. This is a new problem. The current episode started yesterday. The problem has not changed since onset.   Past Medical History:  Diagnosis Date  . Asthma   . Infection 2012   GC, chlamydia    There are no active problems to display for this patient.   Past Surgical History:  Procedure Laterality Date  . KNEE SURGERY     left    OB History    Gravida Para Term Preterm AB Living   0             SAB TAB Ectopic Multiple Live Births                   Home Medications    Prior to Admission medications   Medication Sig Start Date End Date Taking? Authorizing Provider  acetaminophen (TYLENOL) 325 MG tablet Take 650 mg by mouth every 6 (six) hours as needed for mild pain.    Historical Provider, MD  acetaminophen (TYLENOL) 500 MG tablet Take 1 tablet (500 mg total) by mouth every 6 (six) hours as needed for mild pain or moderate pain. 03/05/16   Lavera Guiseana Duo Liu, MD  HYDROcodone-acetaminophen (NORCO/VICODIN) 5-325 MG tablet Take 1 tablet by mouth every 6 (six) hours as needed for moderate pain or severe pain. 03/05/16   Lavera Guiseana Duo Liu, MD  ibuprofen (ADVIL,MOTRIN) 800 MG tablet Take 1 tablet (800 mg total) by mouth every 8  (eight) hours as needed for mild pain or moderate pain. 09/24/15   Trixie DredgeEmily West, PA-C  Lidocaine, Anorectal, 5 % CREA Applied to site of injury as needed for pain. 03/04/16   John Molpus, MD  metoCLOPramide (REGLAN) 10 MG tablet Take 1 tablet (10 mg total) by mouth every 8 (eight) hours as needed for nausea. 08/03/16   Tatyana Kirichenko, PA-C  nitrofurantoin, macrocrystal-monohydrate, (MACROBID) 100 MG capsule Take 1 capsule (100 mg total) by mouth 2 (two) times daily. 03/05/16   Lavera Guiseana Duo Liu, MD  oxyCODONE-acetaminophen (PERCOCET/ROXICET) 5-325 MG tablet Take 1 tablet by mouth every 6 (six) hours as needed for severe pain. 03/08/16   Shon Batonourtney F Horton, MD  triamcinolone (KENALOG) 0.025 % ointment Apply 1 application topically 2 (two) times daily. 03/05/16   Lavera Guiseana Duo Liu, MD    Family History Family History  Problem Relation Age of Onset  . Heart disease Father     Social History Social History  Substance Use Topics  . Smoking status: Former Smoker    Packs/day: 0.25    Years: 2.00    Types: Cigarettes  . Smokeless tobacco: Never Used  . Alcohol use No     Allergies   Amoxicillin and Penicillins  Review of Systems Review of Systems  Genitourinary: Positive for vaginal bleeding.  All other systems reviewed and are negative.    Physical Exam Updated Vital Signs BP 125/85 (BP Location: Left Arm)   Pulse 73   Temp 98.4 F (36.9 C) (Oral)   Resp 18   Ht 5\' 2"  (1.575 m)   Wt 137 lb 7 oz (62.3 kg)   LMP 06/03/2016   SpO2 99%   BMI 25.14 kg/m   Physical Exam  Constitutional: She is oriented to person, place, and time. She appears well-developed and well-nourished. No distress.  HENT:  Head: Normocephalic and atraumatic.  Neck: Normal range of motion. Neck supple.  Cardiovascular: Normal rate and regular rhythm.  Exam reveals no gallop and no friction rub.   No murmur heard. Pulmonary/Chest: Effort normal and breath sounds normal. No respiratory distress. She has no wheezes.    Abdominal: Soft. Bowel sounds are normal. She exhibits no distension. There is no tenderness.  Genitourinary: Vaginal discharge found.  Genitourinary Comments: The cervical os is closed. There is a dark brown to maroon discharge/slight bleeding.  Musculoskeletal: Normal range of motion.  Neurological: She is alert and oriented to person, place, and time.  Skin: Skin is warm and dry. She is not diaphoretic.  Nursing note and vitals reviewed.    ED Treatments / Results  Labs (all labs ordered are listed, but only abnormal results are displayed) Labs Reviewed  PREGNANCY, URINE - Abnormal; Notable for the following:       Result Value   Preg Test, Ur POSITIVE (*)    All other components within normal limits  WET PREP, GENITAL  CBC WITH DIFFERENTIAL/PLATELET  HCG, QUANTITATIVE, PREGNANCY  HIV ANTIBODY (ROUTINE TESTING)  ABO/RH  GC/CHLAMYDIA PROBE AMP ([place]) NOT AT [facility]    EKG  EKG Interpretation None       Radiology No results found.  Procedures Procedures (including critical care time)  Medications Ordered in ED Medications - No data to display   Initial Impression / Assessment and Plan / ED Course  I have reviewed the triage vital signs and the nursing notes.  Pertinent labs & imaging results that were available during my care of the patient were reviewed by me and considered in my medical decision making (see chart for details).  Clinical Course     Ultrasound confirms an intrauterine pregnancy, however there is a moderate to large subchorionic hemorrhage. She will be discharged with vaginal rest, follow-up with OB.  Final Clinical Impressions(s) / ED Diagnoses   Final diagnoses:  None    New Prescriptions New Prescriptions   No medications on file     Geoffery Lyonsouglas Ninfa Giannelli, MD 08/05/16 1903

## 2016-08-06 LAB — HIV ANTIBODY (ROUTINE TESTING W REFLEX): HIV Screen 4th Generation wRfx: NONREACTIVE

## 2016-08-08 ENCOUNTER — Inpatient Hospital Stay (HOSPITAL_COMMUNITY)
Admission: AD | Admit: 2016-08-08 | Discharge: 2016-08-08 | Disposition: A | Discharge status: Home / Self Care | Payer: Medicaid Other | Source: Ambulatory Visit | Attending: Obstetrics & Gynecology | Admitting: Obstetrics & Gynecology

## 2016-08-08 ENCOUNTER — Encounter (HOSPITAL_COMMUNITY): Payer: Self-pay | Admitting: Certified Nurse Midwife

## 2016-08-08 DIAGNOSIS — O418X10 Other specified disorders of amniotic fluid and membranes, first trimester, not applicable or unspecified: Secondary | ICD-10-CM

## 2016-08-08 DIAGNOSIS — Z3A09 9 weeks gestation of pregnancy: Secondary | ICD-10-CM | POA: Diagnosis not present

## 2016-08-08 DIAGNOSIS — O468X1 Other antepartum hemorrhage, first trimester: Secondary | ICD-10-CM | POA: Diagnosis not present

## 2016-08-08 DIAGNOSIS — Z3491 Encounter for supervision of normal pregnancy, unspecified, first trimester: Secondary | ICD-10-CM

## 2016-08-08 MED ORDER — ACETAMINOPHEN 500 MG PO TABS: 1000.0000 mg | ORAL_TABLET | Freq: Four times a day (QID) | ORAL | 0 refills | Status: AC | PRN

## 2016-08-08 MED ORDER — CONCEPT OB 130-92.4-1 MG PO CAPS: 1.0000 | ORAL_CAPSULE | Freq: Every day | ORAL | 12 refills | Status: AC

## 2016-08-08 NOTE — MAU Note (Signed)
Pt states that she was at [facility] 3 days ago for vaginal bleeding and [facility]. Pt states her bleeding is less in the last 3 days. Pt states she is worried about her pregnancy.

## 2016-08-08 NOTE — Discharge Instructions (Signed)
First Trimester of Pregnancy The first trimester of pregnancy is from week 1 until the end of week 12 (months 1 through 3). A week after a sperm fertilizes an egg, the egg will implant on the wall of the uterus. This embryo will begin to develop into a baby. Genes from you and your partner are forming the baby. The female genes determine whether the baby is a boy or a girl. At 6-8 weeks, the eyes and face are formed, and the heartbeat can be seen on ultrasound. At the end of 12 weeks, all the baby's organs are formed.  Now that you are pregnant, you will want to do everything you can to have a healthy baby. Two of the most important things are to get good prenatal care and to follow your health care provider's instructions. Prenatal care is all the medical care you receive before the baby's birth. This care will help prevent, find, and treat any problems during the pregnancy and childbirth. BODY CHANGES Your body goes through many changes during pregnancy. The changes vary from woman to woman.   You may gain or lose a couple of pounds at first.  You may feel sick to your stomach (nauseous) and throw up (vomit). If the vomiting is uncontrollable, call your health care provider.  You may tire easily.  You may develop headaches that can be relieved by medicines approved by your health care provider.  You may urinate more often. Painful urination may mean you have a bladder infection.  You may develop heartburn as a result of your pregnancy.  You may develop constipation because certain hormones are causing the muscles that push waste through your intestines to slow down.  You may develop hemorrhoids or swollen, bulging veins (varicose veins).  Your breasts may begin to grow larger and become tender. Your nipples may stick out more, and the tissue that surrounds them (areola) may become darker.  Your gums may bleed and may be sensitive to brushing and flossing.  Dark spots or blotches (chloasma,  mask of pregnancy) may develop on your face. This will likely fade after the baby is born.  Your menstrual periods will stop.  You may have a loss of appetite.  You may develop cravings for certain kinds of food.  You may have changes in your emotions from day to day, such as being excited to be pregnant or being concerned that something may go wrong with the pregnancy and baby.  You may have more vivid and strange dreams.  You may have changes in your hair. These can include thickening of your hair, rapid growth, and changes in texture. Some women also have hair loss during or after pregnancy, or hair that feels dry or thin. Your hair will most likely return to normal after your baby is born. WHAT TO EXPECT AT YOUR PRENATAL VISITS During a routine prenatal visit:  You will be weighed to make sure you and the baby are growing normally.  Your blood pressure will be taken.  Your abdomen will be measured to track your baby's growth.  The fetal heartbeat will be listened to starting around week 10 or 12 of your pregnancy.  Test results from any previous visits will be discussed. Your health care provider may ask you:  How you are feeling.  If you are feeling the baby move.  If you have had any abnormal symptoms, such as leaking fluid, bleeding, severe headaches, or abdominal cramping.  If you are using any tobacco products,   including cigarettes, chewing tobacco, and electronic cigarettes.  If you have any questions. Other tests that may be performed during your first trimester include:  Blood tests to find your blood type and to check for the presence of any previous infections. They will also be used to check for low iron levels (anemia) and Rh antibodies. Later in the pregnancy, blood tests for diabetes will be done along with other tests if problems develop.  Urine tests to check for infections, diabetes, or protein in the urine.  An ultrasound to confirm the proper growth  and development of the baby.  An amniocentesis to check for possible genetic problems.  Fetal screens for spina bifida and Down syndrome.  You may need other tests to make sure you and the baby are doing well.  HIV (human immunodeficiency virus) testing. Routine prenatal testing includes screening for HIV, unless you choose not to have this test. HOME CARE INSTRUCTIONS  Medicines  Follow your health care provider's instructions regarding medicine use. Specific medicines may be either safe or unsafe to take during pregnancy.  Take your prenatal vitamins as directed.  If you develop constipation, try taking a stool softener if your health care provider approves. Diet  Eat regular, well-balanced meals. Choose a variety of foods, such as meat or vegetable-based protein, fish, milk and low-fat dairy products, vegetables, fruits, and whole grain breads and cereals. Your health care provider will help you determine the amount of weight gain that is right for you.  Avoid raw meat and uncooked cheese. These carry germs that can cause birth defects in the baby.  Eating four or five small meals rather than three large meals a day may help relieve nausea and vomiting. If you start to feel nauseous, eating a few soda crackers can be helpful. Drinking liquids between meals instead of during meals also seems to help nausea and vomiting.  If you develop constipation, eat more high-fiber foods, such as fresh vegetables or fruit and whole grains. Drink enough fluids to keep your urine clear or pale yellow. Activity and Exercise  Exercise only as directed by your health care provider. Exercising will help you:  Control your weight.  Stay in shape.  Be prepared for labor and delivery.  Experiencing pain or cramping in the lower abdomen or low back is a good sign that you should stop exercising. Check with your health care provider before continuing normal exercises.  Try to avoid standing for long  periods of time. Move your legs often if you must stand in one place for a long time.  Avoid heavy lifting.  Wear low-heeled shoes, and practice good posture.  You may continue to have sex unless your health care provider directs you otherwise. Relief of Pain or Discomfort  Wear a good support bra for breast tenderness.   Take warm sitz baths to soothe any pain or discomfort caused by hemorrhoids. Use hemorrhoid cream if your health care provider approves.   Rest with your legs elevated if you have leg cramps or low back pain.  If you develop varicose veins in your legs, wear support hose. Elevate your feet for 15 minutes, 3-4 times a day. Limit salt in your diet. Prenatal Care  Schedule your prenatal visits by the twelfth week of pregnancy. They are usually scheduled monthly at first, then more often in the last 2 months before delivery.  Write down your questions. Take them to your prenatal visits.  Keep all your prenatal visits as directed by your   health care provider. Safety  Wear your seat belt at all times when driving.  Make a list of emergency phone numbers, including numbers for family, friends, the hospital, and police and fire departments. General Tips  Ask your health care provider for a referral to a local prenatal education class. Begin classes no later than at the beginning of month 6 of your pregnancy.  Ask for help if you have counseling or nutritional needs during pregnancy. Your health care provider can offer advice or refer you to specialists for help with various needs.  Do not use hot tubs, steam rooms, or saunas.  Do not douche or use tampons or scented sanitary pads.  Do not cross your legs for long periods of time.  Avoid cat litter boxes and soil used by cats. These carry germs that can cause birth defects in the baby and possibly loss of the fetus by miscarriage or stillbirth.  Avoid all smoking, herbs, alcohol, and medicines not prescribed by  your health care provider. Chemicals in these affect the formation and growth of the baby.  Do not use any tobacco products, including cigarettes, chewing tobacco, and electronic cigarettes. If you need help quitting, ask your health care provider. You may receive counseling support and other resources to help you quit.  Schedule a dentist appointment. At home, brush your teeth with a soft toothbrush and be gentle when you floss. SEEK MEDICAL CARE IF:   You have dizziness.  You have mild pelvic cramps, pelvic pressure, or nagging pain in the abdominal area.  You have persistent nausea, vomiting, or diarrhea.  You have a bad smelling vaginal discharge.  You have pain with urination.  You notice increased swelling in your face, hands, legs, or ankles. SEEK IMMEDIATE MEDICAL CARE IF:   You have a fever.  You are leaking fluid from your vagina.  You have spotting or bleeding from your vagina.  You have severe abdominal cramping or pain.  You have rapid weight gain or loss.  You vomit blood or material that looks like coffee grounds.  You are exposed to German measles and have never had them.  You are exposed to fifth disease or chickenpox.  You develop a severe headache.  You have shortness of breath.  You have any kind of trauma, such as from a fall or a car accident.   This information is not intended to replace advice given to you by your health care provider. Make sure you discuss any questions you have with your health care provider.   Document Released: 09/07/2001 Document Revised: 10/04/2014 Document Reviewed: 07/24/2013 Elsevier Interactive Patient Education 2016 [employer]  

## 2016-08-08 NOTE — MAU Provider Note (Signed)
Lisa Bond is a 25 y.o. G1P0 at 9.[redacted] weeks gestation by US at [facility] 3 days ago who presents to MAU today for F[facility]: [facility]. Bleeding resolved and returned--now light, red. Denies passage of clots or tissue or abdominal pain.  [facility] Sates provider did not tell her what it was.   BP 120/72 (BP Location: Left Arm)   Pulse 75   Temp 98.2 F (36.8 C) (Oral)   Resp 18   LMP  (LMP Unknown)   CONSTITUTIONAL: Well-developed, well-nourished female in no acute physical distress. Very anxious. EYES: Conjunctivae normal.  CARDIOVASCULAR: Regular heart rate RESPIRATORY: Normal effort NEUROLOGICAL: Alert and oriented to person, place, and time.  SKIN: Skin is warm and dry. No rash noted. Not diaphoretic. No erythema. No pallor. PSYCH: Normal mood and affect. Normal behavior. Normal judgment and thought content. Anxious.  Informal BS US shows 9.0 week live SIUP. FHR 171. Pt very relieved.  A: SCH. Live SIUP  P: Discharge home [facility] common cause of bleeding in first trimester and does not increase risk of SAB.  Pregnancy confirmation letter and list of area OB providers given Rx prenatal vitamins First trimester warning signs reviewed Patient may return to MAU as needed or if her condition were to change or worsen   [name], CNM  08/08/2016 9:32 AM

## 2016-08-09 LAB — GC/CHLAMYDIA PROBE AMP (~~LOC~~) NOT AT ARMC
CHLAMYDIA, DNA PROBE: POSITIVE — AB
Neisseria Gonorrhea: POSITIVE — AB

## 2016-09-08 ENCOUNTER — Other Ambulatory Visit: Payer: Self-pay | Admitting: Obstetrics and Gynecology

## 2016-09-09 LAB — CYTOLOGY - PAP

## 2016-09-27 NOTE — L&D Delivery Note (Signed)
Cesarean Section Procedure Note  Indications: 26 yo G1P1 at 39 weeks 4 days with labor dystocia prolonged active phase, arrest of dilation at 7cm.  She was admitted for induction of labor due to preeclampsia with severe features.   Pre-operative Diagnosis: 1. Failure to Progress 2. Preeclampsia with severe features  Post-operative Diagnosis: same plus Occiput Posterior presentation  Surgeon: Travell Desaulniers STACIA   Assistants: none  Anesthesia: Epidural anesthesia  ASA Class: 2  Procedure Details   The patient was counseled about the risks, benefits, complications of the cesarean section. The patient concurred with the proposed plan, giving informed consent.  The site of surgery properly noted/marked. The patient was taken to Operating Room # 9, identified as Brodie M Bunner and the procedure verified as C-Section Delivery. A Time Out was held and the above information confirmed.  After epidural was found to adequate, the patient was placed in the dorsal supine position with a leftward tilt, draped and prepped in the usual sterile manner. A Pfannenstiel incision was made with a 10 blade scalpel and carried down through the subcutaneous tissue to the fascia.  The fascia was incised in the midline and the fascial incision was extended laterally with Mayo scissors. The superior aspect of the fascial incision was grasped with Kocher clamps x2, tented up and the rectus muscles dissected off sharply with the bovie. The rectus was then dissected off with blunt dissection and Mayo scissors inferiorly. The rectus muscles were separated in the midline. The abdominal peritoneum was identified, tented up, entered bluntly and the incision was extended superiorly and inferiorly bluntly with surgeons hands with good visualization of the bladder. The Alexis retractor was then deployed. The vesicouterine peritoneum was identified, tented up, entered sharply with Metzenbaum scissors, and the bladder flap was created  digitally. A fresh 10 blade scalpel was then used to make a low transverse incision on the uterus which was extended laterally with  blunt dissection. The fetal vertex was identified, the head was brought out from the pelvis. A Mity Vacuum was placed to aid in delivery of the vertex.  The head was then delivered easily through the uterine incision followed by the body. The A live female infant was bulb suctioned on the operative field cried vigorously, cord was clamped after one minute and cut and the infant was passed to the waiting neonatologist. Apgars 8/9. Placenta was then delivered spontaneously, intact and appear normal, the uterus was cleared of all clot and debris. The uterine incision was repaired with #0 Monocryl in running locked fashion. A second imbricating suture was performed using the same suture. The incision was hemostatic. Ovaries and tubes were inspected and normal. The Alexis retractor was removed. The abdominal cavity was cleared of all clot and debris. The abdominal peritoneum was reapproximated with 2-0 chromic  in a running fashion, the rectus muscles was reapproximated with #2 chromic in interrupted fashion. The fascia was closed with 0 Vicryl in a running fashion. The subcuticular layer was irrigated and all bleeders cauterized.  10 cc of 0.25% Marcaine was injected into the incision. Scarpas fascia was reapproximated with 2-0 plain gut suture.   The skin was closed with 4-0 vicryl in a subcuticular fashion using a Keith needle. The incision was dressed with benzoine, steri strips and pressure dressing. All sponge lap and needle counts were correct x3. Patient tolerated the procedure well and recovered in stable condition following the procedure.  Instrument, sponge, and needle counts were correct prior the abdominal closure and at   the conclusion of the case.   Findings: Live female infant, Apgars 8/9, clear amniotic fluid, normal appearing placenta, normal uterus, bilateral tubes and  ovaries  Estimated Blood Loss: 750mL         Drains: Foley catheter, 100 mL clear urine at end of procedure         Specimens: Placenta to L&D  IVF: 500 mL LR  Implants: none         Complications:  None; patient tolerated the procedure well.         Disposition: PACU - hemodynamically stable.   Franchot Pollitt STACIA  

## 2016-10-18 ENCOUNTER — Other Ambulatory Visit: Payer: Self-pay | Admitting: Obstetrics & Gynecology

## 2016-10-18 LAB — OB RESULTS CONSOLE GC/CHLAMYDIA: Gonorrhea: POSITIVE

## 2016-12-12 ENCOUNTER — Encounter (HOSPITAL_BASED_OUTPATIENT_CLINIC_OR_DEPARTMENT_OTHER): Payer: Self-pay

## 2016-12-12 ENCOUNTER — Emergency Department (HOSPITAL_BASED_OUTPATIENT_CLINIC_OR_DEPARTMENT_OTHER)
Admission: EM | Admit: 2016-12-12 | Discharge: 2016-12-12 | Disposition: A | Discharge status: Home / Self Care | Payer: Medicaid Other | Attending: Emergency Medicine | Admitting: Emergency Medicine

## 2016-12-12 DIAGNOSIS — R197 Diarrhea, unspecified: Secondary | ICD-10-CM | POA: Insufficient documentation

## 2016-12-12 DIAGNOSIS — Z87891 Personal history of nicotine dependence: Secondary | ICD-10-CM | POA: Diagnosis not present

## 2016-12-12 DIAGNOSIS — O9989 Other specified diseases and conditions complicating pregnancy, childbirth and the puerperium: Secondary | ICD-10-CM

## 2016-12-12 DIAGNOSIS — O212 Late vomiting of pregnancy: Secondary | ICD-10-CM | POA: Diagnosis not present

## 2016-12-12 DIAGNOSIS — O26892 Other specified pregnancy related conditions, second trimester: Secondary | ICD-10-CM | POA: Insufficient documentation

## 2016-12-12 DIAGNOSIS — O2392 Unspecified genitourinary tract infection in pregnancy, second trimester: Secondary | ICD-10-CM | POA: Insufficient documentation

## 2016-12-12 DIAGNOSIS — Z3A27 27 weeks gestation of pregnancy: Secondary | ICD-10-CM | POA: Diagnosis not present

## 2016-12-12 DIAGNOSIS — J45909 Unspecified asthma, uncomplicated: Secondary | ICD-10-CM | POA: Insufficient documentation

## 2016-12-12 DIAGNOSIS — R1013 Epigastric pain: Secondary | ICD-10-CM

## 2016-12-12 DIAGNOSIS — R8271 Bacteriuria: Secondary | ICD-10-CM

## 2016-12-12 DIAGNOSIS — R112 Nausea with vomiting, unspecified: Secondary | ICD-10-CM

## 2016-12-12 LAB — URINALYSIS, MICROSCOPIC (REFLEX)

## 2016-12-12 LAB — URINALYSIS, ROUTINE W REFLEX MICROSCOPIC
BILIRUBIN URINE: NEGATIVE
GLUCOSE, UA: NEGATIVE mg/dL
HGB URINE DIPSTICK: NEGATIVE
KETONES UR: NEGATIVE mg/dL
Nitrite: NEGATIVE
PROTEIN: NEGATIVE mg/dL
Specific Gravity, Urine: 1.029 (ref 1.005–1.030)
pH: 5.5 (ref 5.0–8.0)

## 2016-12-12 MED ORDER — FOSFOMYCIN TROMETHAMINE 3 G PO PACK
3.0000 g | PACK | Freq: Once | ORAL | Status: AC
  Administered 2016-12-12: 3 g via ORAL
  Filled 2016-12-12: qty 3

## 2016-12-12 MED ORDER — METOCLOPRAMIDE HCL 10 MG PO TABS: 10.0000 mg | ORAL_TABLET | Freq: Four times a day (QID) | ORAL | 0 refills | Status: DC

## 2016-12-12 MED ORDER — SODIUM CHLORIDE 0.9 % IV BOLUS (SEPSIS)
1000.0000 mL | Freq: Once | INTRAVENOUS | Status: AC
  Administered 2016-12-12: 1000 mL via INTRAVENOUS

## 2016-12-12 MED ORDER — METOCLOPRAMIDE HCL 5 MG/ML IJ SOLN
10.0000 mg | Freq: Once | INTRAMUSCULAR | Status: AC
  Administered 2016-12-12: 10 mg via INTRAVENOUS
  Filled 2016-12-12: qty 2

## 2016-12-12 NOTE — ED Notes (Signed)
ED Provider at bedside. 

## 2016-12-12 NOTE — Progress Notes (Signed)
Spoke with Dr. Claiborne Billingsallahan. Pt is to be d/c home. FHR tracing is a category 1, no uc's. Okay to d/c fetal monitor.

## 2016-12-12 NOTE — Progress Notes (Signed)
Received call from [facility] RN. Pt is a G1P0 at [redacted] weeks gestation with c/o N&V, and epigastric pain. Says she has been taking erythromycin for 1 week for a STD. No vaginal bleeding or leaking of fluid. Says [facility]. Denies problems with this pregnancy. Blood pressures WNL.

## 2016-12-12 NOTE — ED Triage Notes (Signed)
Pt reports she is 4220w7d pregnant. Reports taking Erythryomycin x 1 week for STD. Pt sts increased nausea and vomiting. Reports epigastric pain.

## 2016-12-12 NOTE — ED Provider Notes (Signed)
[facility]Y [facility] Provider Note   CSN: [id number] Arrival date & time: 12/12/16  1336     History   Chief Complaint Chief Complaint  Patient presents with  . Abdominal Pain    HPI Lisa Bond is a 26 y.o. female.  HPI   Lisa Bond is a 26 y.o. female, with a history of Asthma, gonorrhea, and chlamydia, presenting to the ED with Upper abdominal discomfort beginning 2 days ago. Patient describes her pain as a dull ache, constant, moderate, nonradiating. She also endorses nausea, vomiting, and diarrhea beginning 3-4 days ago. Endorses 4 episodes of vomiting over the past 24 hours. She states she has been taking erythromycin for a diagnosis of chlamydia. She hasn't been taking it 4 times a day for 7 days and just finished it 2 days ago. She has had multiple previous issues with oral antibiotics causing the same symptoms. She is 26 weeks 7 days, G1 P0. She is not expecting any complications. She endorses significant GERD symptoms during this pregnancy that gotten worse during the time taking the antibiotic. She notes that this is the fourth antibiotic she has been prescribed for treatment of her chlamydia due to intolerence from N/V/D and abdominal discomfort. [facility] and is due for her next check up on Friday, March 23.  Denies fever/chills, dizziness, new onset peripheral edema, or any other complaints.      Past Medical History:  Diagnosis Date  . Asthma   . Infection 2012   GC, chlamydia    There are no active problems to display for this patient.   Past Surgical History:  Procedure Laterality Date  . KNEE SURGERY     left    OB History    Gravida Para Term Preterm AB Living   1             SAB TAB Ectopic Multiple Live Births                   Home Medications    Prior to Admission medications   Medication Sig Start Date End Date Taking? Authorizing Provider  acetaminophen (TYLENOL) 500 MG tablet Take 2 tablets (1,000 mg  total) by mouth every 6 (six) hours as needed for mild pain or moderate pain. 08/08/16   Dorathy KinsmanVirginia Smith, CNM  metoCLOPramide (REGLAN) 10 MG tablet Take 1 tablet (10 mg total) by mouth every 6 (six) hours. 12/12/16   Keiera Strathman C Zona Pedro, PA-C  Prenat w/o A Vit-FeFum-FePo-FA (CONCEPT OB) 130-92.4-1 MG CAPS Take 1 tablet by mouth daily. 08/08/16   Dorathy KinsmanVirginia Smith, CNM    Family History Family History  Problem Relation Age of Onset  . Heart disease Father     Social History Social History  Substance Use Topics  . Smoking status: Former Smoker    Packs/day: 0.25    Years: 2.00    Types: Cigarettes  . Smokeless tobacco: Never Used  . Alcohol use No     Allergies   Amoxicillin and Penicillins   Review of Systems Review of Systems  Constitutional: Negative for chills and fever.  Respiratory: Negative for shortness of breath.   Cardiovascular: Negative for chest pain.  Gastrointestinal: Positive for abdominal pain, diarrhea, nausea and vomiting. Negative for blood in stool.  All other systems reviewed and are negative.    Physical Exam Updated Vital Signs BP (!) 144/87 (BP Location: Left Arm)   Pulse (!) 111   Temp 98.4 F (36.9 C) (Oral)  Resp (!) 24 Comment: crying   Ht 5\' 2"  (1.575 m)   Wt 77.1 kg   LMP  (LMP Unknown)   SpO2 100%   BMI 31.09 kg/m   Physical Exam  Constitutional: She appears well-developed and well-nourished. No distress.  HENT:  Head: Normocephalic and atraumatic.  Mouth/Throat: Oropharynx is clear and moist.  Eyes: Conjunctivae are normal.  Neck: Neck supple.  Cardiovascular: Normal rate, regular rhythm, normal heart sounds and intact distal pulses.   Pulmonary/Chest: Effort normal and breath sounds normal. No respiratory distress.  Abdominal: Soft. There is no tenderness. There is no guarding.  Gravid abdomen. Fetal heart rate 140 and regular.  Musculoskeletal: She exhibits no edema.  Lymphadenopathy:    She has no cervical adenopathy.    Neurological: She is alert.  Skin: Skin is warm and dry. She is not diaphoretic.  Psychiatric: She has a normal mood and affect. Her behavior is normal.  Nursing note and vitals reviewed.    ED Treatments / Results  Labs (all labs ordered are listed, but only abnormal results are displayed) Labs Reviewed  URINALYSIS, ROUTINE W REFLEX MICROSCOPIC - Abnormal; Notable for the following:       Result Value   APPearance CLOUDY (*)    Leukocytes, UA SMALL (*)    All other components within normal limits  URINALYSIS, MICROSCOPIC (REFLEX) - Abnormal; Notable for the following:    Bacteria, UA MANY (*)    Squamous Epithelial / LPF 6-30 (*)    All other components within normal limits    EKG  EKG Interpretation None       Radiology No results found.  Procedures Procedures (including critical care time)  Medications Ordered in ED Medications  metoCLOPramide (REGLAN) injection 10 mg (10 mg Intravenous Given 12/12/16 1536)  sodium chloride 0.9 % bolus 1,000 mL (0 mLs Intravenous Stopped 12/12/16 1630)  fosfomycin (MONUROL) packet 3 g (3 g Oral Given 12/12/16 1630)     Initial Impression / Assessment and Plan / ED Course  I have reviewed the triage vital signs and the nursing notes.  Pertinent labs & imaging results that were available during my care of the patient were reviewed by me and considered in my medical decision making (see chart for details).     Patient presents with abdominal discomfort along with nausea, vomiting, and diarrhea. I suspect the patient's symptoms may be due to her recent antibiotic use combined with her already existing GERD. Patient is not hypertensive upon my assessment. She has no proteinuria and no new onset peripheral edema. Abdomen very low suspicion for preeclampsia in this patient. Patient voiced complete improvement in her symptoms following IV fluids and Reglan. OB rapid response was contacted by Madelaine Bhat, RN. Patient underwent fetal monitoring  and was cleared by rapid response. Patient states she is feeling better and is ready for discharge. OB/GYN follow-up. Patient to go to [facility] ED for any worsening symptoms or other concerns while she is pregnant. Patient voices understanding of all instructions and is comfortable with discharge.  Findings and plan of care discussed with Melene Plan, DO.   Vitals:   12/12/16 1342 12/12/16 1450 12/12/16 1538 12/12/16 1638  BP: (!) 144/87 119/71 (!) 102/57 129/73  Pulse: (!) 111 100 98 91  Resp: (!) 24 20 20 20   Temp: 98.4 F (36.9 C)     TempSrc: Oral     SpO2: 100% 96% 98% 100%  Weight: 77.1 kg     Height: 5\' 2"  (1.575  m)         Final Clinical Impressions(s) / ED Diagnoses   Final diagnoses:  Epigastric pain  Nausea vomiting and diarrhea  Asymptomatic bacteriuria during pregnancy    New Prescriptions Discharge Medication List as of 12/12/2016  4:28 PM       Anselm Pancoast, PA-C 12/12/16 1644    Melene Plan, DO 12/12/16 2352

## 2016-12-12 NOTE — Progress Notes (Signed)
Spoke with Dr. Claiborne Billingsallahan. Pt is a G1 P0 at [redacted] weeks gestation presenting [facility] with c/o  Epigastric pain, N&V. Pt says she has been taking erythromycin for 1 week for a STD. FHR baseline is 145, no accels, no decels, moderate variability. No uc's. No vaginal bleeding or leaking of fluid. IVF have been ordered for the pt, reglan 10 mg IV, and they have obtained a U/A. U/A is cloudy with many bacteria. Pt's name and medical record number given to Dr. Claiborne Billingsallahan so she can review the chart.

## 2016-12-12 NOTE — Discharge Instructions (Signed)
Please follow up with your OBGYN as soon as possible. Use the Reglan, as needed, for nausea and vomiting.   You also have evidence of bacteria in your urine, which requires treatment in pregnant women, regardless of symptoms.   Eating yogurt may help with your symptoms by replenishing some of the bacteria in the intestines that may have been killed by the antibiotic.  [facility] for worsening symptoms or onset of any other complaints while you are pregnant.

## 2016-12-12 NOTE — Progress Notes (Signed)
Spoke with Gwenyth OberAdam RN. Says pt denies headache or blurred vision.

## 2016-12-29 ENCOUNTER — Other Ambulatory Visit: Payer: Self-pay | Admitting: Obstetrics and Gynecology

## 2017-02-14 ENCOUNTER — Other Ambulatory Visit: Payer: Self-pay | Admitting: Obstetrics and Gynecology

## 2017-02-14 LAB — OB RESULTS CONSOLE GC/CHLAMYDIA
CHLAMYDIA, DNA PROBE: NEGATIVE
Gonorrhea: NEGATIVE

## 2017-03-08 ENCOUNTER — Encounter (HOSPITAL_COMMUNITY): Payer: Self-pay | Admitting: *Deleted

## 2017-03-08 ENCOUNTER — Inpatient Hospital Stay (HOSPITAL_COMMUNITY)
Admission: AD | Admit: 2017-03-08 | Discharge: 2017-03-13 | DRG: 765 | Disposition: A | Discharge status: Home / Self Care | Payer: Medicaid Other | Source: Ambulatory Visit | Attending: Obstetrics & Gynecology | Admitting: Obstetrics & Gynecology

## 2017-03-08 DIAGNOSIS — O411230 Chorioamnionitis, third trimester, not applicable or unspecified: Secondary | ICD-10-CM | POA: Diagnosis present

## 2017-03-08 DIAGNOSIS — O1493 Unspecified pre-eclampsia, third trimester: Secondary | ICD-10-CM | POA: Diagnosis present

## 2017-03-08 DIAGNOSIS — Z98891 History of uterine scar from previous surgery: Secondary | ICD-10-CM

## 2017-03-08 DIAGNOSIS — Z3A39 39 weeks gestation of pregnancy: Secondary | ICD-10-CM | POA: Diagnosis not present

## 2017-03-08 DIAGNOSIS — Z87891 Personal history of nicotine dependence: Secondary | ICD-10-CM

## 2017-03-08 DIAGNOSIS — O9902 Anemia complicating childbirth: Secondary | ICD-10-CM | POA: Diagnosis present

## 2017-03-08 DIAGNOSIS — O1414 Severe pre-eclampsia complicating childbirth: Principal | ICD-10-CM | POA: Diagnosis present

## 2017-03-08 DIAGNOSIS — O99824 Streptococcus B carrier state complicating childbirth: Secondary | ICD-10-CM | POA: Diagnosis present

## 2017-03-08 DIAGNOSIS — O1494 Unspecified pre-eclampsia, complicating childbirth: Secondary | ICD-10-CM | POA: Diagnosis present

## 2017-03-08 DIAGNOSIS — D649 Anemia, unspecified: Secondary | ICD-10-CM | POA: Diagnosis present

## 2017-03-08 HISTORY — DX: Gastro-esophageal reflux disease without esophagitis: K21.9

## 2017-03-08 HISTORY — DX: Gonococcal infection, unspecified: A54.9

## 2017-03-08 HISTORY — DX: Chlamydial infection, unspecified: A74.9

## 2017-03-08 LAB — COMPREHENSIVE METABOLIC PANEL
ALK PHOS: 188 U/L — AB (ref 38–126)
ALT: 22 U/L (ref 14–54)
AST: 34 U/L (ref 15–41)
Albumin: 2.7 g/dL — ABNORMAL LOW (ref 3.5–5.0)
Anion gap: 8 (ref 5–15)
BUN: 5 mg/dL — AB (ref 6–20)
CALCIUM: 8.8 mg/dL — AB (ref 8.9–10.3)
CHLORIDE: 107 mmol/L (ref 101–111)
CO2: 20 mmol/L — ABNORMAL LOW (ref 22–32)
CREATININE: 0.61 mg/dL (ref 0.44–1.00)
GFR calc Af Amer: >60 mL/min (ref >60)
Glucose, Bld: 99 mg/dL (ref 65–99)
Potassium: 3.6 mmol/L (ref 3.5–5.1)
Sodium: 135 mmol/L (ref 135–145)
Total Bilirubin: 0.3 mg/dL (ref 0.3–1.2)
Total Protein: 6.1 g/dL — ABNORMAL LOW (ref 6.5–8.1)

## 2017-03-08 LAB — CBC
HEMATOCRIT: 32.9 % — AB (ref 36.0–46.0)
HEMOGLOBIN: 11.4 g/dL — AB (ref 12.0–15.0)
MCH: 29.3 pg (ref 26.0–34.0)
MCHC: 34.7 g/dL (ref 30.0–36.0)
MCV: 84.6 fL (ref 78.0–100.0)
PLATELETS: 188 10*3/uL (ref 150–400)
RBC: 3.89 MIL/uL (ref 3.87–5.11)
RDW: 13.6 % (ref 11.5–15.5)
WBC: 11.1 10*3/uL — AB (ref 4.0–10.5)

## 2017-03-08 LAB — URINALYSIS, ROUTINE W REFLEX MICROSCOPIC
BILIRUBIN URINE: NEGATIVE
Glucose, UA: NEGATIVE mg/dL
Hgb urine dipstick: NEGATIVE
Ketones, ur: NEGATIVE mg/dL
Nitrite: NEGATIVE
PH: 5 (ref 5.0–8.0)
Protein, ur: 100 mg/dL — AB
SPECIFIC GRAVITY, URINE: 1.016 (ref 1.005–1.030)

## 2017-03-08 LAB — PROTEIN / CREATININE RATIO, URINE
CREATININE, URINE: 180 mg/dL
Protein Creatinine Ratio: 0.4 mg/mg{Cre} — ABNORMAL HIGH (ref 0.00–0.15)
TOTAL PROTEIN, URINE: 72 mg/dL

## 2017-03-08 LAB — OB RESULTS CONSOLE RPR: RPR: NONREACTIVE

## 2017-03-08 LAB — OB RESULTS CONSOLE ANTIBODY SCREEN: ANTIBODY SCREEN: NEGATIVE

## 2017-03-08 LAB — OB RESULTS CONSOLE RUBELLA ANTIBODY, IGM: Rubella: IMMUNE

## 2017-03-08 LAB — ABO/RH: ABO/RH(D): O POS

## 2017-03-08 LAB — TYPE AND SCREEN
ABO/RH(D): O POS
ANTIBODY SCREEN: NEGATIVE

## 2017-03-08 LAB — OB RESULTS CONSOLE HEPATITIS B SURFACE ANTIGEN: HEP B S AG: NEGATIVE

## 2017-03-08 LAB — OB RESULTS CONSOLE GBS: GBS: POSITIVE

## 2017-03-08 MED ORDER — LABETALOL HCL 5 MG/ML IV SOLN
20.0000 mg | INTRAVENOUS | Status: AC | PRN
Start: 2017-03-08 — End: 2017-03-08
  Administered 2017-03-08: 20 mg via INTRAVENOUS
  Administered 2017-03-08: 80 mg via INTRAVENOUS
  Administered 2017-03-08: 40 mg via INTRAVENOUS
  Filled 2017-03-08: qty 4
  Filled 2017-03-08: qty 16
  Filled 2017-03-08: qty 8

## 2017-03-08 MED ORDER — LABETALOL HCL 5 MG/ML IV SOLN: 20.0000 mg | INTRAVENOUS | Status: DC | PRN

## 2017-03-08 MED ORDER — LABETALOL HCL 5 MG/ML IV SOLN
20.0000 mg | INTRAVENOUS | Status: AC | PRN
  Administered 2017-03-08: 20 mg via INTRAVENOUS
  Administered 2017-03-08: 40 mg via INTRAVENOUS
  Administered 2017-03-08: 20 mg via INTRAVENOUS
  Filled 2017-03-08 (×2): qty 4
  Filled 2017-03-08: qty 8

## 2017-03-08 MED ORDER — LABETALOL HCL 5 MG/ML IV SOLN
INTRAVENOUS | Status: AC
  Filled 2017-03-08: qty 4

## 2017-03-08 MED ORDER — MISOPROSTOL 25 MCG QUARTER TABLET
25.0000 ug | ORAL_TABLET | Freq: Once | ORAL | Status: AC
  Administered 2017-03-08: 25 ug via VAGINAL
  Filled 2017-03-08: qty 1

## 2017-03-08 MED ORDER — HYDRALAZINE HCL 20 MG/ML IJ SOLN
10.0000 mg | Freq: Once | INTRAMUSCULAR | Status: AC | PRN
  Administered 2017-03-08: 10 mg via INTRAVENOUS
  Filled 2017-03-08: qty 1

## 2017-03-08 MED ORDER — HYDRALAZINE HCL 20 MG/ML IJ SOLN
10.0000 mg | Freq: Once | INTRAMUSCULAR | Status: AC
  Administered 2017-03-08: 10 mg via INTRAVENOUS

## 2017-03-08 MED ORDER — LACTATED RINGERS IV SOLN
INTRAVENOUS | Status: DC
  Administered 2017-03-08: 15:00:00 via INTRAVENOUS
  Administered 2017-03-09: 125 mL/h via INTRAVENOUS
  Administered 2017-03-09: 02:00:00 via INTRAVENOUS

## 2017-03-08 MED ORDER — MAGNESIUM SULFATE 40 G IN LACTATED RINGERS - SIMPLE
2.0000 g/h | INTRAVENOUS | Status: DC
Start: 2017-03-08 — End: 2017-03-10
  Administered 2017-03-08 – 2017-03-09 (×2): 2 g/h via INTRAVENOUS
  Filled 2017-03-08 (×2): qty 40

## 2017-03-08 MED ORDER — OXYCODONE-ACETAMINOPHEN 5-325 MG PO TABS: 2.0000 | ORAL_TABLET | ORAL | Status: DC | PRN

## 2017-03-08 MED ORDER — ACETAMINOPHEN 325 MG PO TABS: 650.0000 mg | ORAL_TABLET | ORAL | Status: DC | PRN

## 2017-03-08 MED ORDER — LACTATED RINGERS IV SOLN
500.0000 mL | INTRAVENOUS | Status: DC | PRN
Start: 2017-03-08 — End: 2017-03-10

## 2017-03-08 MED ORDER — OXYCODONE-ACETAMINOPHEN 5-325 MG PO TABS: 1.0000 | ORAL_TABLET | ORAL | Status: DC | PRN

## 2017-03-08 MED ORDER — ONDANSETRON HCL 4 MG/2ML IJ SOLN: 4.0000 mg | Freq: Four times a day (QID) | INTRAMUSCULAR | Status: DC | PRN

## 2017-03-08 MED ORDER — MAGNESIUM SULFATE BOLUS VIA INFUSION
4.0000 g | Freq: Once | INTRAVENOUS | Status: AC
  Administered 2017-03-08: 4 g via INTRAVENOUS
  Filled 2017-03-08: qty 500

## 2017-03-08 MED ORDER — SOD CITRATE-CITRIC ACID 500-334 MG/5ML PO SOLN
30.0000 mL | ORAL | Status: DC | PRN
  Administered 2017-03-08 – 2017-03-10 (×3): 30 mL via ORAL
  Filled 2017-03-08 (×3): qty 15

## 2017-03-08 MED ORDER — LIDOCAINE HCL (PF) 1 % IJ SOLN: 30.0000 mL | INTRAMUSCULAR | Status: DC | PRN

## 2017-03-08 MED ORDER — OXYTOCIN BOLUS FROM INFUSION: 500.0000 mL | Freq: Once | INTRAVENOUS | Status: DC

## 2017-03-08 MED ORDER — OXYTOCIN 40 UNITS IN LACTATED RINGERS INFUSION - SIMPLE MED: 2.5000 [IU]/h | INTRAVENOUS | Status: DC

## 2017-03-08 MED ORDER — CEFAZOLIN SODIUM-DEXTROSE 2-4 GM/100ML-% IV SOLN
2.0000 g | Freq: Once | INTRAVENOUS | Status: AC
  Administered 2017-03-08: 2 g via INTRAVENOUS
  Filled 2017-03-08: qty 100

## 2017-03-08 MED ORDER — CEFAZOLIN SODIUM-DEXTROSE 1-4 GM/50ML-% IV SOLN
1.0000 g | Freq: Three times a day (TID) | INTRAVENOUS | Status: DC
  Administered 2017-03-09 (×3): 1 g via INTRAVENOUS
  Filled 2017-03-08 (×5): qty 50

## 2017-03-08 MED ORDER — LACTATED RINGERS IV SOLN
INTRAVENOUS | Status: DC
  Administered 2017-03-08: 13:00:00 via INTRAVENOUS

## 2017-03-08 NOTE — MAU Note (Signed)
[facility] elevated [facility].   Denies headache +change in vision--spots Denies epigastric pain States noticed increase in swelling over the past couple of weeks   +vaginal pressure

## 2017-03-08 NOTE — Progress Notes (Signed)
Pt declined attempt at AROM earlier. I just attempted an amniotomy without success. Cx is 20/1/-3. Head ballot.  NST reactive B/Ps labile. Pt needed severe courses of labetalol. Will follow.  PLAN/ Will place cytotec and then start Pit.

## 2017-03-08 NOTE — H&P (Signed)
Pt is a 26 y/o black female G1P0 at 4539 2/7 wks who was seen in the office today by Dr. Mora ApplPinn. She was noted to have HTN/proteinuria/vision change/. She was sent over to the ER for evaluation. She continued to have elevated B/Ps in the ER. Her labs indicated that she does not have HEELP syndrome. She is admitted for induction. FHTs reactive. GBS+  PMHX: See hollister PE: HEENT-wnl        ABD-gravid, non tender, no RUQ pain        Exts- no edema, Normal reflex IMP/ IUP with preeclampsia         +GBS Plan/Admit          Start induction

## 2017-03-09 ENCOUNTER — Encounter (HOSPITAL_COMMUNITY): Payer: Self-pay

## 2017-03-09 ENCOUNTER — Inpatient Hospital Stay (HOSPITAL_COMMUNITY): Payer: Medicaid Other | Admitting: Anesthesiology

## 2017-03-09 LAB — CBC
HEMATOCRIT: 35.9 % — AB (ref 36.0–46.0)
HEMOGLOBIN: 12.2 g/dL (ref 12.0–15.0)
MCH: 28.8 pg (ref 26.0–34.0)
MCHC: 34 g/dL (ref 30.0–36.0)
MCV: 84.9 fL (ref 78.0–100.0)
Platelets: 200 10*3/uL (ref 150–400)
RBC: 4.23 MIL/uL (ref 3.87–5.11)
RDW: 13.8 % (ref 11.5–15.5)
WBC: 14.1 10*3/uL — ABNORMAL HIGH (ref 4.0–10.5)

## 2017-03-09 LAB — RPR: RPR: NONREACTIVE

## 2017-03-09 MED ORDER — LACTATED RINGERS IV SOLN: 500.0000 mL | Freq: Once | INTRAVENOUS | Status: DC

## 2017-03-09 MED ORDER — PHENYLEPHRINE 40 MCG/ML (10ML) SYRINGE FOR IV PUSH (FOR BLOOD PRESSURE SUPPORT): 80.0000 ug | PREFILLED_SYRINGE | INTRAVENOUS | Status: DC | PRN

## 2017-03-09 MED ORDER — LIDOCAINE HCL (PF) 1 % IJ SOLN
INTRAMUSCULAR | Status: DC | PRN
  Administered 2017-03-09 (×2): 5 mL

## 2017-03-09 MED ORDER — BUPIVACAINE HCL (PF) 0.25 % IJ SOLN
INTRAMUSCULAR | Status: AC
Start: 2017-03-09 — End: ?
  Filled 2017-03-09: qty 10

## 2017-03-09 MED ORDER — EPHEDRINE 5 MG/ML INJ: 10.0000 mg | INTRAVENOUS | Status: DC | PRN

## 2017-03-09 MED ORDER — FENTANYL CITRATE (PF) 100 MCG/2ML IJ SOLN
INTRAMUSCULAR | Status: DC | PRN
  Administered 2017-03-09 (×2): 50 ug via EPIDURAL
  Administered 2017-03-10: 100 ug via EPIDURAL

## 2017-03-09 MED ORDER — LABETALOL HCL 5 MG/ML IV SOLN
20.0000 mg | INTRAVENOUS | Status: AC | PRN
  Administered 2017-03-09: 40 mg via INTRAVENOUS
  Administered 2017-03-09: 20 mg via INTRAVENOUS
  Administered 2017-03-09: 80 mg via INTRAVENOUS
  Filled 2017-03-09: qty 8
  Filled 2017-03-09: qty 16

## 2017-03-09 MED ORDER — PHENYLEPHRINE 40 MCG/ML (10ML) SYRINGE FOR IV PUSH (FOR BLOOD PRESSURE SUPPORT)
80.0000 ug | PREFILLED_SYRINGE | INTRAVENOUS | Status: DC | PRN
  Filled 2017-03-09: qty 10

## 2017-03-09 MED ORDER — MISOPROSTOL 25 MCG QUARTER TABLET
25.0000 ug | ORAL_TABLET | Freq: Once | ORAL | Status: AC
  Administered 2017-03-09: 25 ug via VAGINAL
  Filled 2017-03-09: qty 1

## 2017-03-09 MED ORDER — LIDOCAINE-EPINEPHRINE (PF) 2 %-1:200000 IJ SOLN
INTRAMUSCULAR | Status: DC | PRN
  Administered 2017-03-09 – 2017-03-10 (×4): 5 mL via EPIDURAL

## 2017-03-09 MED ORDER — OXYTOCIN 40 UNITS IN LACTATED RINGERS INFUSION - SIMPLE MED
1.0000 m[IU]/min | INTRAVENOUS | Status: DC
  Administered 2017-03-09: 2 m[IU]/min via INTRAVENOUS
  Filled 2017-03-09: qty 1000

## 2017-03-09 MED ORDER — TERBUTALINE SULFATE 1 MG/ML IJ SOLN: 0.2500 mg | Freq: Once | INTRAMUSCULAR | Status: DC | PRN

## 2017-03-09 MED ORDER — HYDRALAZINE HCL 20 MG/ML IJ SOLN
10.0000 mg | Freq: Once | INTRAMUSCULAR | Status: AC | PRN
  Administered 2017-03-09: 10 mg via INTRAVENOUS
  Filled 2017-03-09: qty 1

## 2017-03-09 MED ORDER — FENTANYL CITRATE (PF) 100 MCG/2ML IJ SOLN
INTRAMUSCULAR | Status: AC
  Filled 2017-03-09: qty 2

## 2017-03-09 MED ORDER — DIPHENHYDRAMINE HCL 50 MG/ML IJ SOLN: 12.5000 mg | INTRAMUSCULAR | Status: DC | PRN

## 2017-03-09 MED ORDER — LABETALOL HCL 5 MG/ML IV SOLN
INTRAVENOUS | Status: AC
Start: 2017-03-09 — End: 2017-03-10
  Filled 2017-03-09: qty 4

## 2017-03-09 MED ORDER — FENTANYL CITRATE (PF) 100 MCG/2ML IJ SOLN
100.0000 ug | INTRAMUSCULAR | Status: DC | PRN
  Administered 2017-03-09 (×2): 100 ug via INTRAVENOUS
  Filled 2017-03-09 (×2): qty 2

## 2017-03-09 MED ORDER — BUPIVACAINE HCL (PF) 0.25 % IJ SOLN
INTRAMUSCULAR | Status: DC | PRN
  Administered 2017-03-09 (×2): 4 mL via EPIDURAL

## 2017-03-09 MED ORDER — FENTANYL 2.5 MCG/ML BUPIVACAINE 1/10 % EPIDURAL INFUSION (WH - ANES)
14.0000 mL/h | INTRAMUSCULAR | Status: DC | PRN
  Administered 2017-03-09 (×3): 14 mL/h via EPIDURAL
  Filled 2017-03-09 (×3): qty 100

## 2017-03-09 NOTE — Progress Notes (Signed)
Pt changed cx . Now 70/3/-2, AROM clear. Will increase pit. B/Ps better

## 2017-03-09 NOTE — Progress Notes (Signed)
Lisa Bond is a 26 y.o. G1P0 at 5730w3d by LMP admitted for induction of labor due to Pre-eclamptic toxemia of pregnancy..  Subjective: Patient comfortable now with epidural, no HA, no visual changes, no RUQ pain  Objective: BP 127/75 (BP Location: Right Arm)   Pulse 87   Temp 98 F (36.7 C) (Oral)   Resp 16   Ht 5\' 2"  (1.575 m)   Wt 93.9 kg (207 lb)   LMP  (LMP Unknown)   SpO2 98%   BMI 37.86 kg/m  I/O last 3 completed shifts: In: 5033.9 [P.O.:2788; I.V.:2095.9; IV Piggyback:150] Out: 5400 [Urine:5400] No intake/output data recorded.  FHT:  FHR: 115 bpm, variability: moderate,  accelerations:  Present,  decelerations:  Absent UC:   regular, every 5-6 minutes SVE:   Dilation: 4 Effacement (%): 80 Station: -2 Exam by:: MD Delena Casebeer  Labs: Lab Results  Component Value Date   WBC 14.1 (H) 03/09/2017   HGB 12.2 03/09/2017   HCT 35.9 (L) 03/09/2017   MCV 84.9 03/09/2017   PLT 200 03/09/2017    Assessment / Plan: Induction of labor due to preeclampsia,  progressing well on pitocin IUPC and FSE placed  Labor: Progressing normally Preeclampsia:  on magnesium sulfate, no signs or symptoms of toxicity, intake and ouput balanced and labs stable Fetal Wellbeing:  Category I Pain Control:  Epidural I/D:  n/a Anticipated MOD:  NSVD  Lisa Bond 03/09/2017, 9:38 AM

## 2017-03-09 NOTE — Anesthesia Preprocedure Evaluation (Addendum)
Anesthesia Evaluation  Patient identified by MRN, date of birth, ID band Patient awake    Reviewed: Allergy & Precautions, H&P , NPO status , Patient's Chart, lab work & pertinent test results  History of Anesthesia Complications Negative for: history of anesthetic complications  Airway Mallampati: II  TM Distance: >3 FB Neck ROM: full    Dental no notable dental hx. (+) Teeth Intact   Pulmonary asthma , former smoker,    Pulmonary exam normal breath sounds clear to auscultation       Cardiovascular hypertension, Pt. on medications and Pt. on home beta blockers Normal cardiovascular exam Rhythm:regular Rate:Normal     Neuro/Psych negative neurological ROS  negative psych ROS   GI/Hepatic negative GI ROS, Neg liver ROS,   Endo/Other  negative endocrine ROS  Renal/GU negative Renal ROS  negative genitourinary   Musculoskeletal   Abdominal   Peds  Hematology negative hematology ROS (+)   Anesthesia Other Findings   Reproductive/Obstetrics (+) Pregnancy Pre eclamptic                             Anesthesia Physical Anesthesia Plan  ASA: II  Anesthesia Plan: Epidural   Post-op Pain Management:    Induction:   PONV Risk Score and Plan:   Airway Management Planned:   Additional Equipment:   Intra-op Plan:   Post-operative Plan:   Informed Consent: I have reviewed the patients History and Physical, chart, labs and discussed the procedure including the risks, benefits and alternatives for the proposed anesthesia with the patient or authorized representative who has indicated his/her understanding and acceptance.     Plan Discussed with:   Anesthesia Plan Comments:         Anesthesia Quick Evaluation

## 2017-03-09 NOTE — Anesthesia Procedure Notes (Signed)
Epidural Patient location during procedure: OB  Staffing Anesthesiologist: Carter Kassel Performed: anesthesiologist   Preanesthetic Checklist Completed: patient identified, site marked, surgical consent, pre-op evaluation, timeout performed, IV checked, risks and benefits discussed and monitors and equipment checked  Epidural Patient position: sitting Prep: DuraPrep Patient monitoring: heart rate, continuous pulse ox and blood pressure Approach: right paramedian Location: L3-L4 Injection technique: LOR saline  Needle:  Needle type: Tuohy  Needle gauge: 17 G Needle length: 9 cm and 9 Needle insertion depth: 6 cm Catheter type: closed end flexible Catheter size: 20 Guage Catheter at skin depth: 10 cm Test dose: negative  Assessment Events: blood not aspirated, injection not painful, no injection resistance, negative IV test and no paresthesia  Additional Notes Patient identified. Risks/Benefits/Options discussed with patient including but not limited to bleeding, infection, nerve damage, paralysis, failed block, incomplete pain control, headache, blood pressure changes, nausea, vomiting, reactions to medication both or allergic, itching and postpartum back pain. Confirmed with bedside nurse the patient's most recent platelet count. Confirmed with patient that they are not currently taking any anticoagulation, have any bleeding history or any family history of bleeding disorders. Patient expressed understanding and wished to proceed. All questions were answered. Sterile technique was used throughout the entire procedure. Please see nursing notes for vital signs. Test dose was given through epidural needle and negative prior to continuing to dose epidural or start infusion. Warning signs of high block given to the patient including shortness of breath, tingling/numbness in hands, complete motor block, or any concerning symptoms with instructions to call for help. Patient was given  instructions on fall risk and not to get out of bed. All questions and concerns addressed with instructions to call with any issues.     

## 2017-03-09 NOTE — Anesthesia Pain Management Evaluation Note (Signed)
  [facility] Pain Management Visit Note  Patient: Lisa Bond, 26 y.o., female  "Hello I am a member of the anesthesia team at [facility]. We have an anesthesia team available at all times to provide care throughout the hospital, including epidural management and anesthesia for C-section. I don't know your plan for the delivery whether it a natural birth, water birth, IV sedation, nitrous supplementation, doula or epidural, but we want to meet your pain goals."   1.Was your pain managed to your expectations on prior hospitalizations?   No prior hospitalizations  2.What is your expectation for pain management during this hospitalization?     Epidural  3.How can we help you reach that goal? Pt just recently had epidural placed and is very comfortable.  Record the patient's initial score and the patient's pain goal.   Pain: 0  Pain Goal: 5 [facility] wants you to be able to say your pain was always managed very well.  Lisa Bond 03/09/2017

## 2017-03-10 ENCOUNTER — Encounter (HOSPITAL_COMMUNITY): Payer: Self-pay | Admitting: *Deleted

## 2017-03-10 ENCOUNTER — Encounter (HOSPITAL_COMMUNITY)
Admission: AD | Disposition: A | Discharge status: Home / Self Care | Payer: Self-pay | Source: Ambulatory Visit | Attending: Obstetrics & Gynecology

## 2017-03-10 DIAGNOSIS — Z98891 History of uterine scar from previous surgery: Secondary | ICD-10-CM

## 2017-03-10 LAB — COMPREHENSIVE METABOLIC PANEL
ALBUMIN: 2.6 g/dL — AB (ref 3.5–5.0)
ALT: 20 U/L (ref 14–54)
AST: 39 U/L (ref 15–41)
Alkaline Phosphatase: 189 U/L — ABNORMAL HIGH (ref 38–126)
Anion gap: 9 (ref 5–15)
BUN: 6 mg/dL (ref 6–20)
CHLORIDE: 104 mmol/L (ref 101–111)
CO2: 20 mmol/L — AB (ref 22–32)
CREATININE: 0.92 mg/dL (ref 0.44–1.00)
Calcium: 8.2 mg/dL — ABNORMAL LOW (ref 8.9–10.3)
GFR calc Af Amer: >60 mL/min (ref >60)
GFR calc non Af Amer: >60 mL/min (ref >60)
GLUCOSE: 115 mg/dL — AB (ref 65–99)
POTASSIUM: 4.3 mmol/L (ref 3.5–5.1)
SODIUM: 133 mmol/L — AB (ref 135–145)
Total Bilirubin: 0.6 mg/dL (ref 0.3–1.2)
Total Protein: 6.3 g/dL — ABNORMAL LOW (ref 6.5–8.1)

## 2017-03-10 LAB — CBC
HCT: 34.2 % — ABNORMAL LOW (ref 36.0–46.0)
HCT: 33.9 % — ABNORMAL LOW (ref 36.0–46.0)
HEMOGLOBIN: 11.5 g/dL — AB (ref 12.0–15.0)
Hemoglobin: 11.7 g/dL — ABNORMAL LOW (ref 12.0–15.0)
MCH: 29 pg (ref 26.0–34.0)
MCH: 28.9 pg (ref 26.0–34.0)
MCHC: 34.2 g/dL (ref 30.0–36.0)
MCHC: 33.9 g/dL (ref 30.0–36.0)
MCV: 84.7 fL (ref 78.0–100.0)
MCV: 85.2 fL (ref 78.0–100.0)
Platelets: 240 10*3/uL (ref 150–400)
Platelets: 214 10*3/uL (ref 150–400)
RBC: 4.04 MIL/uL (ref 3.87–5.11)
RBC: 3.98 MIL/uL (ref 3.87–5.11)
RDW: 14.2 % (ref 11.5–15.5)
RDW: 14.1 % (ref 11.5–15.5)
WBC: 26.9 10*3/uL — AB (ref 4.0–10.5)
WBC: 34.5 10*3/uL — ABNORMAL HIGH (ref 4.0–10.5)

## 2017-03-10 SURGERY — Surgical Case
Anesthesia: Epidural

## 2017-03-10 MED ORDER — NALBUPHINE HCL 10 MG/ML IJ SOLN: 5.0000 mg | Freq: Once | INTRAMUSCULAR | Status: DC | PRN

## 2017-03-10 MED ORDER — HYDRALAZINE HCL 20 MG/ML IJ SOLN: 10.0000 mg | Freq: Once | INTRAMUSCULAR | Status: DC | PRN

## 2017-03-10 MED ORDER — ACETAMINOPHEN 500 MG PO TABS
1000.0000 mg | ORAL_TABLET | Freq: Once | ORAL | Status: AC
Start: 2017-03-10 — End: 2017-03-10
  Administered 2017-03-10: 1000 mg via ORAL
  Filled 2017-03-10: qty 2

## 2017-03-10 MED ORDER — MENTHOL 3 MG MT LOZG: 1.0000 | LOZENGE | OROMUCOSAL | Status: DC | PRN

## 2017-03-10 MED ORDER — SODIUM BICARBONATE 8.4 % IV SOLN
INTRAVENOUS | Status: AC
  Filled 2017-03-10: qty 50

## 2017-03-10 MED ORDER — SENNOSIDES-DOCUSATE SODIUM 8.6-50 MG PO TABS
2.0000 | ORAL_TABLET | ORAL | Status: DC
  Administered 2017-03-11 – 2017-03-13 (×3): 2 via ORAL
  Filled 2017-03-10 (×3): qty 2

## 2017-03-10 MED ORDER — SCOPOLAMINE 1 MG/3DAYS TD PT72
MEDICATED_PATCH | TRANSDERMAL | Status: DC | PRN
  Administered 2017-03-10: 1 via TRANSDERMAL

## 2017-03-10 MED ORDER — ONDANSETRON HCL 4 MG/2ML IJ SOLN: 4.0000 mg | Freq: Three times a day (TID) | INTRAMUSCULAR | Status: DC | PRN

## 2017-03-10 MED ORDER — LACTATED RINGERS IV SOLN
INTRAVENOUS | Status: DC
  Administered 2017-03-10 – 2017-03-11 (×2): via INTRAVENOUS

## 2017-03-10 MED ORDER — ACETAMINOPHEN 325 MG PO TABS: 650.0000 mg | ORAL_TABLET | ORAL | Status: DC | PRN

## 2017-03-10 MED ORDER — CEFAZOLIN SODIUM-DEXTROSE 2-4 GM/100ML-% IV SOLN
INTRAVENOUS | Status: AC
  Filled 2017-03-10: qty 100

## 2017-03-10 MED ORDER — WITCH HAZEL-GLYCERIN EX PADS: 1.0000 "application " | MEDICATED_PAD | CUTANEOUS | Status: DC | PRN

## 2017-03-10 MED ORDER — SIMETHICONE 80 MG PO CHEW
80.0000 mg | CHEWABLE_TABLET | Freq: Three times a day (TID) | ORAL | Status: DC
  Administered 2017-03-10 – 2017-03-13 (×10): 80 mg via ORAL
  Filled 2017-03-10 (×10): qty 1

## 2017-03-10 MED ORDER — OXYTOCIN 10 UNIT/ML IJ SOLN
INTRAVENOUS | Status: DC | PRN
  Administered 2017-03-10: 40 [IU] via INTRAVENOUS

## 2017-03-10 MED ORDER — MAGNESIUM SULFATE BOLUS VIA INFUSION
4.0000 g | Freq: Once | INTRAVENOUS | Status: AC
  Administered 2017-03-10: 4 g via INTRAVENOUS
  Filled 2017-03-10: qty 500

## 2017-03-10 MED ORDER — SIMETHICONE 80 MG PO CHEW
80.0000 mg | CHEWABLE_TABLET | ORAL | Status: DC
  Administered 2017-03-11 – 2017-03-13 (×3): 80 mg via ORAL
  Filled 2017-03-10 (×3): qty 1

## 2017-03-10 MED ORDER — OXYCODONE-ACETAMINOPHEN 5-325 MG PO TABS
2.0000 | ORAL_TABLET | ORAL | Status: DC | PRN
  Administered 2017-03-11 – 2017-03-13 (×4): 2 via ORAL
  Filled 2017-03-10 (×4): qty 2

## 2017-03-10 MED ORDER — DIPHENHYDRAMINE HCL 25 MG PO CAPS: 25.0000 mg | ORAL_CAPSULE | ORAL | Status: DC | PRN

## 2017-03-10 MED ORDER — MAGNESIUM SULFATE 40 G IN LACTATED RINGERS - SIMPLE
2.0000 g/h | INTRAVENOUS | Status: DC
  Administered 2017-03-11: 2 g/h via INTRAVENOUS
  Filled 2017-03-10: qty 500
  Filled 2017-03-10: qty 40

## 2017-03-10 MED ORDER — HYDROMORPHONE HCL 1 MG/ML IJ SOLN
INTRAMUSCULAR | Status: AC
  Filled 2017-03-10: qty 1

## 2017-03-10 MED ORDER — PRENATAL MULTIVITAMIN CH
1.0000 | ORAL_TABLET | Freq: Every day | ORAL | Status: DC
  Administered 2017-03-10 – 2017-03-13 (×4): 1 via ORAL
  Filled 2017-03-10 (×4): qty 1

## 2017-03-10 MED ORDER — ZOLPIDEM TARTRATE 5 MG PO TABS: 5.0000 mg | ORAL_TABLET | Freq: Every evening | ORAL | Status: DC | PRN

## 2017-03-10 MED ORDER — SCOPOLAMINE 1 MG/3DAYS TD PT72: 1.0000 | MEDICATED_PATCH | Freq: Once | TRANSDERMAL | Status: DC

## 2017-03-10 MED ORDER — GENTAMICIN SULFATE 40 MG/ML IJ SOLN
160.0000 mg | Freq: Three times a day (TID) | INTRAVENOUS | Status: DC
  Administered 2017-03-10: 160 mg via INTRAVENOUS
  Filled 2017-03-10 (×2): qty 4

## 2017-03-10 MED ORDER — PROMETHAZINE HCL 25 MG/ML IJ SOLN: 6.2500 mg | INTRAMUSCULAR | Status: DC | PRN

## 2017-03-10 MED ORDER — OXYCODONE-ACETAMINOPHEN 5-325 MG PO TABS
1.0000 | ORAL_TABLET | ORAL | Status: DC | PRN
  Administered 2017-03-10 – 2017-03-13 (×4): 1 via ORAL
  Filled 2017-03-10 (×5): qty 1

## 2017-03-10 MED ORDER — KETOROLAC TROMETHAMINE 30 MG/ML IJ SOLN
30.0000 mg | Freq: Four times a day (QID) | INTRAMUSCULAR | Status: AC | PRN
  Administered 2017-03-10: 30 mg via INTRAMUSCULAR

## 2017-03-10 MED ORDER — SODIUM CHLORIDE 0.9% FLUSH: 3.0000 mL | INTRAVENOUS | Status: DC | PRN

## 2017-03-10 MED ORDER — BUPIVACAINE HCL (PF) 0.25 % IJ SOLN
INTRAMUSCULAR | Status: DC | PRN
  Administered 2017-03-10: 10 mL

## 2017-03-10 MED ORDER — ONDANSETRON HCL 4 MG/2ML IJ SOLN
INTRAMUSCULAR | Status: AC
  Filled 2017-03-10: qty 4

## 2017-03-10 MED ORDER — IBUPROFEN 600 MG PO TABS
600.0000 mg | ORAL_TABLET | Freq: Four times a day (QID) | ORAL | Status: DC
  Administered 2017-03-10 – 2017-03-13 (×13): 600 mg via ORAL
  Filled 2017-03-10 (×14): qty 1

## 2017-03-10 MED ORDER — COCONUT OIL OIL
1.0000 "application " | TOPICAL_OIL | Status: DC | PRN
  Administered 2017-03-12: 1 via TOPICAL
  Filled 2017-03-10: qty 120

## 2017-03-10 MED ORDER — KETOROLAC TROMETHAMINE 30 MG/ML IJ SOLN: 30.0000 mg | Freq: Once | INTRAMUSCULAR | Status: DC | PRN

## 2017-03-10 MED ORDER — DIPHENHYDRAMINE HCL 50 MG/ML IJ SOLN: 12.5000 mg | INTRAMUSCULAR | Status: DC | PRN

## 2017-03-10 MED ORDER — NALBUPHINE HCL 10 MG/ML IJ SOLN: 5.0000 mg | INTRAMUSCULAR | Status: DC | PRN

## 2017-03-10 MED ORDER — LACTATED RINGERS IV SOLN
INTRAVENOUS | Status: DC | PRN
  Administered 2017-03-10 (×2): via INTRAVENOUS

## 2017-03-10 MED ORDER — KETOROLAC TROMETHAMINE 30 MG/ML IJ SOLN
INTRAMUSCULAR | Status: AC
  Administered 2017-03-10: 30 mg via INTRAMUSCULAR
  Filled 2017-03-10: qty 1

## 2017-03-10 MED ORDER — HYDROMORPHONE HCL 1 MG/ML IJ SOLN
INTRAMUSCULAR | Status: DC | PRN
  Administered 2017-03-10: 1 mg via INTRAVENOUS

## 2017-03-10 MED ORDER — FENTANYL CITRATE (PF) 100 MCG/2ML IJ SOLN
INTRAMUSCULAR | Status: AC
  Filled 2017-03-10: qty 2

## 2017-03-10 MED ORDER — PHENYLEPHRINE 40 MCG/ML (10ML) SYRINGE FOR IV PUSH (FOR BLOOD PRESSURE SUPPORT)
PREFILLED_SYRINGE | INTRAVENOUS | Status: AC
  Filled 2017-03-10: qty 20

## 2017-03-10 MED ORDER — OXYTOCIN 10 UNIT/ML IJ SOLN
INTRAMUSCULAR | Status: AC
  Filled 2017-03-10: qty 4

## 2017-03-10 MED ORDER — NALOXONE HCL 0.4 MG/ML IJ SOLN
0.4000 mg | INTRAMUSCULAR | Status: DC | PRN
Start: 2017-03-10 — End: 2017-03-13

## 2017-03-10 MED ORDER — DEXAMETHASONE SODIUM PHOSPHATE 4 MG/ML IJ SOLN
INTRAMUSCULAR | Status: AC
  Filled 2017-03-10: qty 1

## 2017-03-10 MED ORDER — LABETALOL HCL 5 MG/ML IV SOLN: 20.0000 mg | INTRAVENOUS | Status: DC | PRN

## 2017-03-10 MED ORDER — GENTAMICIN SULFATE 40 MG/ML IJ SOLN
Freq: Three times a day (TID) | INTRAVENOUS | Status: DC
  Administered 2017-03-10 – 2017-03-11 (×3): via INTRAVENOUS
  Filled 2017-03-10 (×4): qty 4

## 2017-03-10 MED ORDER — TETANUS-DIPHTH-ACELL PERTUSSIS 5-2.5-18.5 LF-MCG/0.5 IM SUSP: 0.5000 mL | Freq: Once | INTRAMUSCULAR | Status: DC

## 2017-03-10 MED ORDER — CLINDAMYCIN PHOSPHATE 900 MG/50ML IV SOLN
900.0000 mg | Freq: Three times a day (TID) | INTRAVENOUS | Status: DC
  Administered 2017-03-10: 900 mg via INTRAVENOUS
  Filled 2017-03-10 (×2): qty 50

## 2017-03-10 MED ORDER — HYDROMORPHONE HCL 1 MG/ML IJ SOLN: 0.2500 mg | INTRAMUSCULAR | Status: DC | PRN

## 2017-03-10 MED ORDER — DIPHENHYDRAMINE HCL 25 MG PO CAPS
25.0000 mg | ORAL_CAPSULE | Freq: Four times a day (QID) | ORAL | Status: DC | PRN
  Administered 2017-03-10: 25 mg via ORAL
  Filled 2017-03-10: qty 1

## 2017-03-10 MED ORDER — MORPHINE SULFATE (PF) 0.5 MG/ML IJ SOLN
INTRAMUSCULAR | Status: AC
  Filled 2017-03-10: qty 10

## 2017-03-10 MED ORDER — LIDOCAINE-EPINEPHRINE (PF) 2 %-1:200000 IJ SOLN
INTRAMUSCULAR | Status: AC
  Filled 2017-03-10: qty 20

## 2017-03-10 MED ORDER — OXYTOCIN 40 UNITS IN LACTATED RINGERS INFUSION - SIMPLE MED: 2.5000 [IU]/h | INTRAVENOUS | Status: AC

## 2017-03-10 MED ORDER — SIMETHICONE 80 MG PO CHEW
80.0000 mg | CHEWABLE_TABLET | ORAL | Status: DC | PRN
Start: 2017-03-10 — End: 2017-03-13

## 2017-03-10 MED ORDER — ONDANSETRON HCL 4 MG/2ML IJ SOLN
INTRAMUSCULAR | Status: DC | PRN
  Administered 2017-03-10: 4 mg via INTRAVENOUS

## 2017-03-10 MED ORDER — SODIUM BICARBONATE 8.4 % IV SOLN: INTRAVENOUS | Status: DC | PRN

## 2017-03-10 MED ORDER — DIBUCAINE 1 % RE OINT: 1.0000 "application " | TOPICAL_OINTMENT | RECTAL | Status: DC | PRN

## 2017-03-10 MED ORDER — KETOROLAC TROMETHAMINE 30 MG/ML IJ SOLN: 30.0000 mg | Freq: Four times a day (QID) | INTRAMUSCULAR | Status: AC | PRN

## 2017-03-10 MED ORDER — CEFAZOLIN SODIUM-DEXTROSE 2-3 GM-% IV SOLR
INTRAVENOUS | Status: DC | PRN
  Administered 2017-03-10: 2 g via INTRAVENOUS

## 2017-03-10 MED ORDER — SODIUM CHLORIDE 0.9 % IR SOLN
Status: DC | PRN
  Administered 2017-03-10: 1000 mL

## 2017-03-10 MED ORDER — DEXAMETHASONE SODIUM PHOSPHATE 4 MG/ML IJ SOLN
INTRAMUSCULAR | Status: DC | PRN
  Administered 2017-03-10: 4 mg via INTRAVENOUS

## 2017-03-10 MED ORDER — SCOPOLAMINE 1 MG/3DAYS TD PT72
MEDICATED_PATCH | TRANSDERMAL | Status: AC
  Filled 2017-03-10: qty 2

## 2017-03-10 MED ORDER — MEPERIDINE HCL 25 MG/ML IJ SOLN: 6.2500 mg | INTRAMUSCULAR | Status: DC | PRN

## 2017-03-10 MED ORDER — MORPHINE SULFATE (PF) 0.5 MG/ML IJ SOLN
INTRAMUSCULAR | Status: DC | PRN
  Administered 2017-03-10: 1 mg via INTRAVENOUS
  Administered 2017-03-10: 4 mg via EPIDURAL

## 2017-03-10 MED ORDER — NALOXONE HCL 2 MG/2ML IJ SOSY: 1.0000 ug/kg/h | PREFILLED_SYRINGE | INTRAVENOUS | Status: DC | PRN

## 2017-03-10 SURGICAL SUPPLY — 44 items
APL SKNCLS STERI-STRIP NONHPOA (GAUZE/BANDAGES/DRESSINGS) ×1
BENZOIN TINCTURE PRP APPL 2/3 (GAUZE/BANDAGES/DRESSINGS) ×3 IMPLANT
CHLORAPREP W/TINT 26ML (MISCELLANEOUS) ×3 IMPLANT
CLAMP CORD UMBIL (MISCELLANEOUS) IMPLANT
CLOSURE WOUND 1/2 X4 (GAUZE/BANDAGES/DRESSINGS) ×1
CLOTH BEACON ORANGE TIMEOUT ST (SAFETY) ×3 IMPLANT
DECANTER SPIKE VIAL GLASS SM (MISCELLANEOUS) ×2 IMPLANT
DRSG OPSITE POSTOP 4X10 (GAUZE/BANDAGES/DRESSINGS) ×3 IMPLANT
ELECT REM PT RETURN 9FT ADLT (ELECTROSURGICAL) ×3
ELECTRODE REM PT RTRN 9FT ADLT (ELECTROSURGICAL) ×1 IMPLANT
EXTRACTOR VACUUM BELL STYLE (SUCTIONS) ×2 IMPLANT
EXTRACTOR VACUUM KIWI (MISCELLANEOUS) IMPLANT
GAUZE SPONGE 4X4 12PLY STRL LF (GAUZE/BANDAGES/DRESSINGS) ×4 IMPLANT
GLOVE BIO SURGEON STRL SZ 6.5 (GLOVE) ×2 IMPLANT
GLOVE BIO SURGEONS STRL SZ 6.5 (GLOVE) ×1
GLOVE BIOGEL M 6.5 STRL (GLOVE) ×2 IMPLANT
GLOVE BIOGEL PI IND STRL 6.5 (GLOVE) IMPLANT
GLOVE BIOGEL PI IND STRL 7.0 (GLOVE) ×2 IMPLANT
GLOVE BIOGEL PI INDICATOR 6.5 (GLOVE) ×2
GLOVE BIOGEL PI INDICATOR 7.0 (GLOVE) ×4
GOWN STRL REUS W/TWL LRG LVL3 (GOWN DISPOSABLE) ×9 IMPLANT
KIT ABG SYR 3ML LUER SLIP (SYRINGE) IMPLANT
NDL HYPO 25X5/8 SAFETYGLIDE (NEEDLE) IMPLANT
NEEDLE HYPO 22GX1.5 SAFETY (NEEDLE) IMPLANT
NEEDLE HYPO 25X5/8 SAFETYGLIDE (NEEDLE) IMPLANT
NS IRRIG 1000ML POUR BTL (IV SOLUTION) ×3 IMPLANT
PACK C SECTION WH (CUSTOM PROCEDURE TRAY) ×3 IMPLANT
PAD ABD 7.5X8 STRL (GAUZE/BANDAGES/DRESSINGS) ×2 IMPLANT
PAD OB MATERNITY 4.3X12.25 (PERSONAL CARE ITEMS) ×3 IMPLANT
PENCIL SMOKE EVAC W/HOLSTER (ELECTROSURGICAL) ×3 IMPLANT
RTRCTR C-SECT PINK 25CM LRG (MISCELLANEOUS) ×3 IMPLANT
STRIP CLOSURE SKIN 1/2X4 (GAUZE/BANDAGES/DRESSINGS) ×2 IMPLANT
SUT CHROMIC 2 0 CT 1 (SUTURE) ×6 IMPLANT
SUT MNCRL 0 VIOLET CTX 36 (SUTURE) ×2 IMPLANT
SUT MONOCRYL 0 CTX 36 (SUTURE) ×4
SUT PDS AB 0 CTX 36 PDP370T (SUTURE) IMPLANT
SUT PLAIN 2 0 (SUTURE) ×3
SUT PLAIN ABS 2-0 CT1 27XMFL (SUTURE) IMPLANT
SUT VIC AB 0 CTX 36 (SUTURE) ×6
SUT VIC AB 0 CTX36XBRD ANBCTRL (SUTURE) ×2 IMPLANT
SUT VIC AB 4-0 KS 27 (SUTURE) ×3 IMPLANT
SYR CONTROL 10ML LL (SYRINGE) IMPLANT
TOWEL OR 17X24 6PK STRL BLUE (TOWEL DISPOSABLE) ×3 IMPLANT
TRAY FOLEY BAG SILVER LF 14FR (SET/KITS/TRAYS/PACK) ×3 IMPLANT

## 2017-03-10 NOTE — Progress Notes (Signed)
On antibiotics for chorio; will continue post op 24 hours or at least 24 hour afeb.

## 2017-03-10 NOTE — Progress Notes (Signed)
Lisa DrownKeyona M Bond is a 26 y.o. G1P0 at 9264w4d by LMP admitted for induction of labor due to Pre-eclamptic toxemia of pregnancy..  Subjective: Patient frustrated that she has not progressed in labor She is comfortable now that epidural was replaced  Objective: BP (!) 149/64   Pulse (!) 115   Temp (!) 100.5 F (38.1 C) (Axillary)   Resp 20   Ht 5\' 2"  (1.575 m)   Wt 93.9 kg (207 lb)   LMP  (LMP Unknown)   SpO2 98%   BMI 37.86 kg/m  I/O last 3 completed shifts: In: 5033.9 [P.O.:2788; I.V.:2095.9; IV Piggyback:150] Out: 8850 [Urine:8850] Total I/O In: 1686 [P.O.:1080; I.V.:506; IV Piggyback:100] Out: 850 [Urine:850]  FHT:  FHR: 140 bpm, variability: minimal ,  accelerations:  Abscent,  decelerations:  Absent UC:   irregular, every 4-6 minutes SVE:   Dilation: 7 Effacement (%): 80 Station: 0 Exam by:: amwalker,rn  Labs: Lab Results  Component Value Date   WBC 14.1 (H) 03/09/2017   HGB 12.2 03/09/2017   HCT 35.9 (L) 03/09/2017   MCV 84.9 03/09/2017   PLT 200 03/09/2017    Assessment / Plan: S/p IOL for preeclampsia at 39 weeks, no with intramniotic infection Tmax 100.5 Patient w/o sx of preeclampsia and labs have been stable Discussed with patient options to continue augmenting her labor. Patient would like  To proceed with a primary cesarean delivery.    Essie HartINN, Alec Jaros STACIA 03/10/2017, 1:27 AM

## 2017-03-10 NOTE — Anesthesia Postprocedure Evaluation (Signed)
Anesthesia Post Note  Patient: Lisa DrownKeyona M Bond  Procedure(s) Performed: Procedure(s) (LRB): CESAREAN SECTION (N/A)     Patient location during evaluation: Mother Baby Anesthesia Type: Epidural Level of consciousness: awake Pain management: pain level controlled Vital Signs Assessment: post-procedure vital signs reviewed and stable Respiratory status: spontaneous breathing Cardiovascular status: blood pressure returned to baseline Postop Assessment: no headache, patient able to bend at knees, no backache, no signs of nausea or vomiting, epidural receding and adequate PO intake Anesthetic complications: no    Last Vitals:  Vitals:   03/10/17 0554 03/10/17 0614  BP: (!) 144/83 138/78  Pulse: (!) 101 (!) 104  Resp: 20 20  Temp: 36.9 C 36.9 C    Last Pain:  Vitals:   03/10/17 0614  TempSrc: Oral  PainSc:    Pain Goal:                 [name], [name]

## 2017-03-10 NOTE — Progress Notes (Signed)
Pharmacy Antibiotic Note  Pam DrownKeyona M Buonocore is a 26 y.o. female admitted on 03/08/2017 with chorioamnionitis.  Pharmacy has been consulted for gentamicin dosing.  Plan: 1. Gentamicin 160 mg IV q8h 2. Will continue to follow   Height: 5\' 2"  (157.5 cm) Weight: 207 lb (93.9 kg) IBW/kg (Calculated) : 50.1  Temp (24hrs), Avg:98.5 F (36.9 C), Min:97.7 F (36.5 C), Max:100.5 F (38.1 C)   Recent Labs Lab 03/08/17 1208 03/09/17 0737  WBC 11.1* 14.1*  CREATININE 0.61  --     Estimated Creatinine Clearance: 114.7 mL/min (by C-G formula based on SCr of 0.61 mg/dL).    Allergies  Allergen Reactions  . Amoxicillin Hives  . Penicillins Hives    Never had medication. Found out from allergy test     Antimicrobials this admission: gent 6/14 >>  clinda 6/14 >>    Thank you for allowing pharmacy to be a part of this patient's care.  Lenore MannerHolcombe, Layden Caterino [place] 03/10/2017 12:23 AM

## 2017-03-10 NOTE — Progress Notes (Signed)
  Patient is eating, ambulating, voiding.  Pain control is good.  Vitals:   03/10/17 0500 03/10/17 0526 03/10/17 0554 03/10/17 0614  BP: (!) 152/85 (!) 161/96 (!) 144/83 138/78  Pulse: (!) 107 92 (!) 101 (!) 104  Resp: (!) 22 20 20 20   Temp:  98.5 F (36.9 C) 98.5 F (36.9 C) 98.5 F (36.9 C)  TempSrc:   Oral Oral  SpO2: 96% 98% 95% 95%  Weight:      Height:        lungs:   clear to auscultation cor:    RRR Abdomen:  soft, appropriate tenderness, incisions intact and without erythema or exudate ex:    no cords   Lab Results  Component Value Date   WBC 26.9 (H) 03/10/2017   HGB 11.7 (L) 03/10/2017   HCT 34.2 (L) 03/10/2017   MCV 84.7 03/10/2017   PLT 240 03/10/2017    --/--/O POS, O POS (06/12 1230)/RI  A/P    Post operative day 0.  Routine post op and postpartum care.  Expect d/c routine.  Percocet for pain control.

## 2017-03-10 NOTE — Op Note (Signed)
Cesarean Section Procedure Note  Indications: 26 yo G1P1 at 39 weeks 4 days with labor dystocia prolonged active phase, arrest of dilation at 7cm.  She was admitted for induction of labor due to preeclampsia with severe features.   Pre-operative Diagnosis: 1. Failure to Progress 2. Preeclampsia with severe features  Post-operative Diagnosis: same plus Occiput Posterior presentation  Surgeon: Wynonia Hazard   Assistants: none  Anesthesia: Epidural anesthesia  ASA Class: 2  Procedure Details   The patient was counseled about the risks, benefits, complications of the cesarean section. The patient concurred with the proposed plan, giving informed consent.  The site of surgery properly noted/marked. The patient was taken to Operating Room # 9, identified as GUY TONEY and the procedure verified as C-Section Delivery. A Time Out was held and the above information confirmed.  After epidural was found to adequate, the patient was placed in the dorsal supine position with a leftward tilt, draped and prepped in the usual sterile manner. A Pfannenstiel incision was made with a 10 blade scalpel and carried down through the subcutaneous tissue to the fascia.  The fascia was incised in the midline and the fascial incision was extended laterally with Mayo scissors. The superior aspect of the fascial incision was grasped with Kocher clamps x2, tented up and the rectus muscles dissected off sharply with the bovie. The rectus was then dissected off with blunt dissection and Mayo scissors inferiorly. The rectus muscles were separated in the midline. The abdominal peritoneum was identified, tented up, entered bluntly and the incision was extended superiorly and inferiorly bluntly with surgeons hands with good visualization of the bladder. The Alexis retractor was then deployed. The vesicouterine peritoneum was identified, tented up, entered sharply with Metzenbaum scissors, and the bladder flap was created  digitally. A fresh 10 blade scalpel was then used to make a low transverse incision on the uterus which was extended laterally with  blunt dissection. The fetal vertex was identified, the head was brought out from the pelvis. A Mity Vacuum was placed to aid in delivery of the vertex.  The head was then delivered easily through the uterine incision followed by the body. The A live female infant was bulb suctioned on the operative field cried vigorously, cord was clamped after one minute and cut and the infant was passed to the waiting neonatologist. Apgars 8/9. Placenta was then delivered spontaneously, intact and appear normal, the uterus was cleared of all clot and debris. The uterine incision was repaired with #0 Monocryl in running locked fashion. A second imbricating suture was performed using the same suture. The incision was hemostatic. Ovaries and tubes were inspected and normal. The Alexis retractor was removed. The abdominal cavity was cleared of all clot and debris. The abdominal peritoneum was reapproximated with 2-0 chromic  in a running fashion, the rectus muscles was reapproximated with #2 chromic in interrupted fashion. The fascia was closed with 0 Vicryl in a running fashion. The subcuticular layer was irrigated and all bleeders cauterized.  10 cc of 0.25% Marcaine was injected into the incision. Scarpas fascia was reapproximated with 2-0 plain gut suture.   The skin was closed with 4-0 vicryl in a subcuticular fashion using a Mellody Dance needle. The incision was dressed with benzoine, steri strips and pressure dressing. All sponge lap and needle counts were correct x3. Patient tolerated the procedure well and recovered in stable condition following the procedure.  Instrument, sponge, and needle counts were correct prior the abdominal closure and at  the conclusion of the case.   Findings: Live female infant, Apgars 8/9, clear amniotic fluid, normal appearing placenta, normal uterus, bilateral tubes and  ovaries  Estimated Blood Loss: 750mL         Drains: Foley catheter, 100 mL clear urine at end of procedure         Specimens: Placenta to L&D  IVF: 500 mL LR  Implants: none         Complications:  None; patient tolerated the procedure well.         Disposition: PACU - hemodynamically stable.   Andrian Urbach STACIA

## 2017-03-10 NOTE — Transfer of Care (Signed)
Immediate Anesthesia Transfer of Care Note  Patient: Lisa Bond  Procedure(s) Performed: Procedure(s): CESAREAN SECTION (N/A)  Patient Location: PACU  Anesthesia Type:Epidural  Level of Consciousness: awake  Airway & Oxygen Therapy: Patient Spontanous Breathing  Post-op Assessment: Report given to RN  Post vital signs: Reviewed and stable  Last Vitals:  Vitals:   03/10/17 0554 03/10/17 0614  BP: (!) 144/83 138/78  Pulse: (!) 101 (!) 104  Resp: 20 20  Temp: 36.9 C 36.9 C    Last Pain:  Vitals:   03/10/17 0614  TempSrc: Oral  PainSc:          Complications: No apparent anesthesia complications

## 2017-03-10 NOTE — Lactation Note (Signed)
This note was copied from a baby's chart. Lactation Consultation Note  Patient Name: Boy Gweneth FritterKeyona Pluta ZOXWR'UToday's Date: 03/10/2017 Reason for consult: Initial assessment Breastfeeding consultation services and support information given and reviewed.  This is mom's first baby and newborn is 8011 hours old.  Mom states baby just recently breastfed on both breasts.  Instructed to feed with any feeding cue and to call for assist prn.  Maternal Data Does the patient have breastfeeding experience prior to this delivery?: No  Feeding Feeding Type: Breast Fed  LATCH Score/Interventions                      Lactation Tools Discussed/Used     Consult Status Consult Status: Follow-up Date: 03/11/17 Follow-up type: In-patient    Huston FoleyMOULDEN, Jacqueline Spofford S 03/10/2017, 2:09 PM

## 2017-03-11 LAB — CBC WITH DIFFERENTIAL/PLATELET
Basophils Absolute: 0 10*3/uL (ref 0.0–0.1)
Basophils Relative: 0 %
EOS PCT: 1 %
Eosinophils Absolute: 0.1 10*3/uL (ref 0.0–0.7)
HEMATOCRIT: 26.9 % — AB (ref 36.0–46.0)
HEMOGLOBIN: 9.3 g/dL — AB (ref 12.0–15.0)
LYMPHS ABS: 2.3 10*3/uL (ref 0.7–4.0)
LYMPHS PCT: 13 %
MCH: 29.7 pg (ref 26.0–34.0)
MCHC: 34.6 g/dL (ref 30.0–36.0)
MCV: 85.9 fL (ref 78.0–100.0)
Monocytes Absolute: 0.8 10*3/uL (ref 0.1–1.0)
Monocytes Relative: 4 %
Neutro Abs: 15.3 10*3/uL — ABNORMAL HIGH (ref 1.7–7.7)
Neutrophils Relative %: 82 %
PLATELETS: 208 10*3/uL (ref 150–400)
RBC: 3.13 MIL/uL — AB (ref 3.87–5.11)
RDW: 14.7 % (ref 11.5–15.5)
WBC: 18.5 10*3/uL — AB (ref 4.0–10.5)

## 2017-03-11 NOTE — Progress Notes (Signed)
Pt was asleep in bed with infant when I entered room. Pt woke up stating that she was in pain and asked me to put baby in bassinet. I reviewed the importance of not sleeping with infant. Scheduled Ibuprofen given. Sheryn BisonGordon, Melbert Botelho Warner

## 2017-03-11 NOTE — Progress Notes (Addendum)
Patient is eating, ambulating, voiding.  Pain control is good.  Denies abd tenderness.  Appropriate lochia, no complaints.  Vitals:   03/11/17 0557 03/11/17 0645 03/11/17 0704 03/11/17 0717  BP:    (!) 152/81  Pulse:    93  Resp: 16 16 18 18   Temp:    97.5 F (36.4 C)  TempSrc:    Oral  SpO2:    94%  Weight:      Height:        Fundus firm Inc: c/d/i Ext; no CT  Lab Results  Component Value Date   WBC 18.5 (H) 03/11/2017   HGB 9.3 (L) 03/11/2017   HCT 26.9 (L) 03/11/2017   MCV 85.9 03/11/2017   PLT 208 03/11/2017    --/--/O POS, O POS (06/12 1230)  A/P Post partum day 1. Gentamycin discontinued, afebrile > 24 hrs  Routine care.  Expect d/c 6/16. Circ planned in office.    Philip AspenALLAHAN, Sherece Gambrill

## 2017-03-11 NOTE — Lactation Note (Signed)
This note was copied from a baby's chart. Lactation Consultation Note  Patient Name: Lisa Gweneth FritterKeyona Bond WUJWJ'XToday's Date: 03/11/2017 Reason for consult: Follow-up assessment  Baby 32 hours old. Mom reports that baby is nursing well. Mom latching baby when this LC entered the room. Enc mom to turn baby more chest-to-chest and have baby's hands/arms on either side of her breast. Baby able to latch deeply and suckle rhythmically with intermittent swallows noted. Discussed cluster-feeding and enc mom to continue to latch baby with cues. Mom states that her milk is flowing freely, and reports no concerns at this time.   Maternal Data    Feeding Feeding Type: Breast Fed  LATCH Score/Interventions Latch: Repeated attempts needed to sustain latch, nipple held in mouth throughout feeding, stimulation needed to elicit sucking reflex. Intervention(s): Adjust position;Assist with latch;Breast compression  Audible Swallowing: Spontaneous and intermittent Intervention(s): Skin to skin  Type of Nipple: Everted at rest and after stimulation  Comfort (Breast/Nipple): Soft / non-tender     Hold (Positioning): Assistance needed to correctly position infant at breast and maintain latch. Intervention(s): Position options  LATCH Score: 8  Lactation Tools Discussed/Used     Consult Status Consult Status: Follow-up Date: 03/12/17 Follow-up type: In-patient    Sherlyn HayJennifer D Cashtyn Pouliot 03/11/2017, 11:26 AM

## 2017-03-11 NOTE — Progress Notes (Signed)
CSW acknowledged consult and attempted to meet with MOB. However, bedside nurse was attending to MOB and infant.  CSW will meet with MOB at a later time.  Lisa Bond, MSW, LCSW Clinical Social Work [phone]    

## 2017-03-12 MED ORDER — FERROUS SULFATE 325 (65 FE) MG PO TABS
325.0000 mg | ORAL_TABLET | Freq: Every day | ORAL | Status: DC
  Administered 2017-03-13: 325 mg via ORAL
  Filled 2017-03-12: qty 1

## 2017-03-12 NOTE — Progress Notes (Signed)
Patient is eating, ambulating, voiding.  Pain control is good.  Appropriate lochia, + flatus and BM.  No complaints.  Denies HA/vision change/RUQ pain.  Vitals:   03/12/17 0624 03/12/17 0700 03/12/17 0808 03/12/17 0900  BP:   (!) 165/104 130/87  Pulse:   91 90  Resp: 18 18 18 16   Temp:   98.3 F (36.8 C)   TempSrc:   Oral   SpO2:   94%   Weight:      Height:        Fundus firm, abd NT Inc: c/d/i Perineum without swelling.  Lab Results  Component Value Date   WBC 18.5 (H) 03/11/2017   HGB 9.3 (L) 03/11/2017   HCT 26.9 (L) 03/11/2017   MCV 85.9 03/11/2017   PLT 208 03/11/2017    --/--/O POS, O POS (06/12 1230)  A/P Post op day #2 s/p c/s for FTP/OP after IOL for severe PEC One severe range BP f/b normal BP this am.  Pt states it is after she was up and moving and in pain.  Otherwise normal and mild range BPs.  Will continue to monitor Anemia - add FeSO4 qd  Alderwood ManorALLAHAN, Luther ParodySIDNEY

## 2017-03-12 NOTE — Progress Notes (Signed)
CSW received consult for hx of marijuana use.  Referral was screened out due to the following: ~MOB had no documented substance use after initial prenatal visit/+UPT. ~MOB had no positive drug screens after initial prenatal visit/+UPT. ~Baby's UDS is negative.  Please consult CSW if current concerns arise or by MOB's request.  CSW will monitor CDS results and make report to Child Protective Services if warranted.  Reaghan Kawa, MSW, LCSW-A Clinical Social Worker  [facility]  Office: [phone]   

## 2017-03-12 NOTE — Lactation Note (Signed)
This note was copied from a baby's chart. Lactation Consultation Note  Patient Name: Lisa Gweneth FritterKeyona Bond WUJWJ'XToday's Date: 03/12/2017 Reason for consult: Follow-up assessment   Follow up with mom of 55 hour old infant. Infant with 10 BF for 10-45 minutes, 2 voids and 3 stools in last 24 hours. LATCH SCores 8 by bedside RN. Infant weight 7 ln 10.4 oz with weight loss of 6%.   Mom reports her breasts are feeling a little fuller. She reports she has been hand expressing and is seeing colostrum. She reports nipple tenderness with initial latch that improves with feeding. She is using EBM and was given Coconut oil for nipples this am. She reports she is waiting for infant to open mouth wide, performing chin tug after latch, supporting him with pillows with feedings and hand expressing. Enc mom to keep up the good work.   Mom reports "someone" suggested a NS due to nipple soreness, however this LC does not feel mom qualifies for one at this time based on information gathered from mom. Mom was left LC phone # to call for feeding assistance as needed.   [facility] reviewed, mom aware of OP services, BF Support Groups and LC phone #. [facility] and [facility] to call for appt post d/c. Mom is working on setting up Ped appt post d/c. Reviewed I/O, Engorgement prevention/treatment, nipple care and Breast milk handling and storage.   Report to Jerrell MylarKim Caudle, RN.   Maternal Data Formula Feeding for Exclusion: Yes Reason for exclusion: Mother's choice to formula and breast feed on admission Has patient been taught Hand Expression?: Yes Does the patient have breastfeeding experience prior to this delivery?: No  Feeding Feeding Type: Breast Fed Length of feed: 10 min  LATCH Score/Interventions                      Lactation Tools Discussed/Used WIC Program: Yes   Consult Status Consult Status: Follow-up Date: 03/13/17 Follow-up type: In-patient    [name] 03/12/2017, 10:34  AM

## 2017-03-13 MED ORDER — OXYCODONE-ACETAMINOPHEN 5-325 MG PO TABS: 2.0000 | ORAL_TABLET | ORAL | 0 refills | Status: DC | PRN

## 2017-03-13 NOTE — Lactation Note (Signed)
This note was copied from a baby's chart. Lactation Consultation Note  Patient Name: Lisa Gweneth FritterKeyona Binegar ZOXWR'UToday's Date: Bond Reason for consult: Follow-up assessment   Baby 2378 hours old.  Baby sleeping STS on mother's chest. Mother states her nipple soreness has improved.  She is applying ebm, coconut oil and waiting for wide open gape. Mom encouraged to feed baby 8-12 times/24 hours and with feeding cues.  Reviewed engorgement care and monitoring voids/stools.    Maternal Data    Feeding Feeding Type: Breast Fed Length of feed: 20 min  LATCH Score/Interventions Latch: Grasps breast easily, tongue down, lips flanged, rhythmical sucking. Intervention(s): Adjust position;Assist with latch  Audible Swallowing: A few with stimulation Intervention(s): Skin to skin;Hand expression;Alternate breast massage  Type of Nipple: Everted at rest and after stimulation  Comfort (Breast/Nipple): Filling, red/small blisters or bruises, mild/mod discomfort  Problem noted: Mild/Moderate discomfort Interventions (Mild/moderate discomfort): Hand expression  Hold (Positioning): Assistance needed to correctly position infant at breast and maintain latch. Intervention(s): Support Pillows;Position options;Skin to skin  LATCH Score: 7  Lactation Tools Discussed/Used     Consult Status Consult Status: Complete    [name], [name], 9:39 AM

## 2017-03-13 NOTE — Progress Notes (Signed)
Patient is eating, ambulating, voiding.  Pain control is good.  Minimal vaginal bleeding. No HA/vision change, RUQ pain. Vitals:   03/12/17 2135 03/13/17 0100 03/13/17 0529 03/13/17 0744  BP:  (!) 146/96 (!) 150/78 (!) 143/89  Pulse:  91 88 92  Resp: 18 16 15 16   Temp:  99.1 F (37.3 C) 98.5 F (36.9 C) 98.6 F (37 C)  TempSrc:  Oral Oral Oral  SpO2:  99% 100% 100%  Weight:      Height:        Fundus firm Perineum without swelling.  Lab Results  Component Value Date   WBC 18.5 (H) 03/11/2017   HGB 9.3 (L) 03/11/2017   HCT 26.9 (L) 03/11/2017   MCV 85.9 03/11/2017   PLT 208 03/11/2017    --/--/O POS, O POS (06/12 1230)  A/P Post op day #3 c/s BPs normal and mild range, last severe range 24hrs ago. Pt would like to d/c home today.  Routine care.   Philip AspenALLAHAN, Nella Botsford

## 2017-03-13 NOTE — Discharge Summary (Signed)
Obstetric Discharge Summary Reason for Admission: induction of labor and severe PEC Prenatal Procedures: Preeclampsia and ultrasound Intrapartum Procedures: cesarean: low cervical, transverse Postpartum Procedures: none Complications-Operative and Postpartum: none Hemoglobin  Date Value Ref Range Status  03/11/2017 9.3 (L) 12.0 - 15.0 g/dL Final   HCT  Date Value Ref Range Status  03/11/2017 26.9 (L) 36.0 - 46.0 % Final    Physical Exam:  General: alert and cooperative Lochia: appropriate Uterine Fundus: firm Incision: healing well, no significant drainage DVT Evaluation: No evidence of DVT seen on physical exam.  Discharge Diagnoses: Term Pregnancy-delivered  Discharge Information: Date: 03/13/2017 Activity: pelvic rest Diet: routine Medications: PNV, Ibuprofen and Percocet Condition: stable Instructions: refer to practice specific booklet Discharge to: home Follow-up Information    Essie HartPinn, Walda, MD Follow up in 3 week(s).   Specialty:  Obstetrics and Gynecology Contact information: [address] [place] [id number] [phone]           Newborn Data: Live born female  Birth Weight: 8 lb 2.3 oz (3695 g) APGAR: 8, 9  Home with mother.  Philip AspenCALLAHAN, Ruford Dudzinski 03/13/2017, 7:58 AM

## 2017-03-13 NOTE — Progress Notes (Addendum)
Passed medium lemon size clot.Fundus U/! Firm bleeding is moderate to small.  (Charted on wrong patient)

## 2017-03-13 NOTE — Lactation Note (Signed)
This note was copied from a baby's chart. Lactation Consultation Note  Patient Name: Lisa Gweneth FritterKeyona Bond WUJWJ'XToday's Date: 03/13/2017 Reason for consult: Follow-up assessment Mom called for breast assessment and assist.  Breasts are full but not engorged.  Baby having difficulty latching to left breast due to fullness and areolar edema.  Reverse pressure done and baby latched easily to breast.  Audible swallows heard.  Recommended mom put a bra on to help reduce edema.  Reassured mom.  Instructed on engorgement treatment if breasts become hard and painful.  Maternal Data    Feeding Feeding Type: Breast Fed Length of feed: 15 min  LATCH Score/Interventions Latch: Grasps breast easily, tongue down, lips flanged, rhythmical sucking. Intervention(s): Adjust position;Assist with latch;Breast massage;Breast compression  Audible Swallowing: Spontaneous and intermittent Intervention(s): Alternate breast massage;Hand expression;Skin to skin  Type of Nipple: Flat  Comfort (Breast/Nipple): Filling, red/small blisters or bruises, mild/mod discomfort  Problem noted: Mild/Moderate discomfort Interventions (Mild/moderate discomfort): Reverse pressue;Hand expression  Hold (Positioning): Assistance needed to correctly position infant at breast and maintain latch. Intervention(s): Breastfeeding basics reviewed;Support Pillows;Position options;Skin to skin  LATCH Score: 7  Lactation Tools Discussed/Used     Consult Status Consult Status: Complete    Huston FoleyMOULDEN, Konstantine Gervasi S 03/13/2017, 2:26 PM

## 2017-03-15 ENCOUNTER — Telehealth (HOSPITAL_COMMUNITY): Payer: Self-pay

## 2017-03-15 NOTE — Telephone Encounter (Signed)
Mom calling with concerns about engorgement and wanting to stop breast feeding.  Mom reports baby is now getting formula in bottles.  Mom has WIC and peds appointment today.  LC discussed cabbage leaves, ice and decreased stimulation to breast.  LC explained to mom it may take 24 hours.  Mom denies symptoms of mastitis and knows to call OB as needed.

## 2017-09-08 ENCOUNTER — Other Ambulatory Visit: Payer: Self-pay

## 2017-09-08 ENCOUNTER — Emergency Department (HOSPITAL_BASED_OUTPATIENT_CLINIC_OR_DEPARTMENT_OTHER)
Admission: EM | Admit: 2017-09-08 | Discharge: 2017-09-08 | Disposition: A | Discharge status: Home / Self Care | Payer: Medicaid Other | Attending: Physician Assistant | Admitting: Physician Assistant

## 2017-09-08 ENCOUNTER — Encounter (HOSPITAL_BASED_OUTPATIENT_CLINIC_OR_DEPARTMENT_OTHER): Payer: Self-pay | Admitting: Emergency Medicine

## 2017-09-08 DIAGNOSIS — R070 Pain in throat: Secondary | ICD-10-CM | POA: Diagnosis present

## 2017-09-08 DIAGNOSIS — Z87891 Personal history of nicotine dependence: Secondary | ICD-10-CM | POA: Insufficient documentation

## 2017-09-08 DIAGNOSIS — H9201 Otalgia, right ear: Secondary | ICD-10-CM | POA: Insufficient documentation

## 2017-09-08 DIAGNOSIS — R6883 Chills (without fever): Secondary | ICD-10-CM | POA: Insufficient documentation

## 2017-09-08 DIAGNOSIS — F1210 Cannabis abuse, uncomplicated: Secondary | ICD-10-CM | POA: Diagnosis not present

## 2017-09-08 DIAGNOSIS — Z79899 Other long term (current) drug therapy: Secondary | ICD-10-CM | POA: Diagnosis not present

## 2017-09-08 DIAGNOSIS — J020 Streptococcal pharyngitis: Secondary | ICD-10-CM | POA: Diagnosis not present

## 2017-09-08 DIAGNOSIS — J45909 Unspecified asthma, uncomplicated: Secondary | ICD-10-CM | POA: Diagnosis not present

## 2017-09-08 LAB — RAPID STREP SCREEN (MED CTR MEBANE ONLY): Streptococcus, Group A Screen (Direct): POSITIVE — AB

## 2017-09-08 MED ORDER — PHENOL 1.4 % MT LIQD: 1.0000 | OROMUCOSAL | 0 refills | Status: DC | PRN

## 2017-09-08 MED ORDER — IBUPROFEN 800 MG PO TABS
800.0000 mg | ORAL_TABLET | Freq: Three times a day (TID) | ORAL | 0 refills | Status: DC
Start: 2017-09-08 — End: 2022-07-20

## 2017-09-08 MED ORDER — CLINDAMYCIN HCL 300 MG PO CAPS: 300.0000 mg | ORAL_CAPSULE | Freq: Three times a day (TID) | ORAL | 0 refills | Status: AC

## 2017-09-08 MED FILL — CLINDAMYCIN HCL 300 MG CAPS: 300 | 10 days supply | Qty: 30 | Fill #0

## 2017-09-08 MED FILL — IBUPROFEN 800 MG TAB: 800 | 10 days supply | Qty: 30 | Fill #0

## 2017-09-08 NOTE — ED Provider Notes (Signed)
[facility] EMERGENCY DEPARTMENT Provider Note   CSN: [id number] Arrival date & time: 09/08/17  1015     History   Chief Complaint Chief Complaint  Patient presents with  . Sore Throat    HPI Lisa Bond is a 26 y.o. female presenting with sore throat.  Patient states that for the past 2 days, she has had right-sided sore throat.  It is constant, and hurts with swallowing.  She is able to handle secretions easily.  She reports chills without fevers.  She states pain radiates to her right ear.  She has not taken anything for her symptoms. She denies eye pain, nasal congestion, sinus pressure, cough, chest pain, shortness breath, nausea, vomiting, or abdominal pain.  She states her 67-month-old son has nasal congestion and cough for the past several days.  No other sick contacts.  She has no other medical problems, does not take medications daily.  She is allergic to penicillins.   HPI  Past Medical History:  Diagnosis Date  . Asthma    last inhaler 3 days ago  . Chlamydia   . GERD (gastroesophageal reflux disease)   . Gonorrhea   . Infection 2012   GC, chlamydia    Patient Active Problem List   Diagnosis Date Noted  . Status post repeat low transverse cesarean section 03/10/2017  . Preeclampsia, third trimester 03/08/2017    Past Surgical History:  Procedure Laterality Date  . CESAREAN SECTION N/A 03/10/2017   Procedure: CESAREAN SECTION;  Surgeon: Essie Hart, MD;  Location: [facility] BIRTHING SUITES;  Service: Obstetrics;  Laterality: N/A;  . KNEE SURGERY     left    OB History    Gravida Para Term Preterm AB Living   1 1 1     1    SAB TAB Ectopic Multiple Live Births         0 1       Home Medications    Prior to Admission medications   Medication Sig Start Date End Date Taking? Authorizing Provider  acetaminophen (TYLENOL) 500 MG tablet Take 2 tablets (1,000 mg total) by mouth every 6 (six) hours as needed for mild pain or moderate pain. Patient  not taking: Reported on 03/08/2017 08/08/16   Katrinka Blazing [place], CNM  albuterol (PROVENTIL HFA) [phone] Base) MCG/ACT inhaler Inhale 1-2 puffs into the lungs every 6 (six) hours as needed for shortness of breath.     [provider]  clindamycin (CLEOCIN) 300 MG capsule Take 1 capsule (300 mg total) by mouth 3 (three) times daily for 10 days. 09/08/17 09/18/17  Caitlyn Buchanan, PA-C  fluticasone-salmeterol (ADVAIR HFA) 230-21 MCG/ACT inhaler Inhale 2 puffs into the lungs daily as needed (SOB).     [provider]  ibuprofen (ADVIL,MOTRIN) 800 MG tablet Take 1 tablet (800 mg total) by mouth 3 (three) times daily with meals. 09/08/17   Lile Mccurley, PA-C  oxyCODONE-acetaminophen (PERCOCET/ROXICET) 5-325 MG tablet Take 2 tablets by mouth every 4 (four) hours as needed (pain scale > 7). 03/13/17   Philip Aspen, DO  phenol (CHLORASEPTIC) 1.4 % LIQD Use as directed 1 spray in the mouth or throat as needed for throat irritation / pain. 09/08/17   Alfie Rideaux, PA-C  Prenat w/o A Vit-FeFum-FePo-FA (CONCEPT OB) 130-92.4-1 MG CAPS Take 1 tablet by mouth daily. 08/08/16   Katrinka Blazing, [place], CNM  ranitidine (ZANTAC) 150 MG tablet Take 150 mg by mouth 2 (two) times daily.    [provider]  Family History Family History  Problem Relation Age of Onset  . Heart disease Father     Social History Social History   Tobacco Use  . Smoking status: Former Smoker    Packs/day: 0.25    Years: 2.00    Pack years: 0.50    Types: Cigarettes  . Smokeless tobacco: Never Used  Substance Use Topics  . Alcohol use: No  . Drug use: Yes    Types: Marijuana    Comment: stopped 07/2016     Allergies   Amoxicillin and Penicillins   Review of Systems Review of Systems  Constitutional: Positive for chills. Negative for fever.  HENT: Positive for ear pain and sore throat. Negative for congestion, sinus pressure, sinus pain and voice change.      Physical Exam Updated  Vital Signs BP 128/71 (BP Location: Left Arm)   Pulse 86   Temp 98.5 F (36.9 C) (Oral)   Resp 18   Ht 5\' 3"  (1.6 m)   Wt 77.1 kg (170 lb)   LMP 09/08/2017   SpO2 100%   BMI 30.11 kg/m   Physical Exam  Constitutional: She is oriented to person, place, and time. She appears well-developed and well-nourished. No distress.  HENT:  Head: Normocephalic and atraumatic.  Right Ear: Tympanic membrane, external ear and ear canal normal.  Left Ear: Tympanic membrane, external ear and ear canal normal.  Nose: Nose normal. Right sinus exhibits no maxillary sinus tenderness and no frontal sinus tenderness. Left sinus exhibits no maxillary sinus tenderness and no frontal sinus tenderness.  Mouth/Throat: Uvula is midline, oropharynx is clear and moist and mucous membranes are normal. Tonsils are 2+ on the right. No tonsillar exudate.  Right-sided tonsillar hypertrophy without exudate or obvious abscess.  Uvula midline with equal palate rise.  No trismus.  Handling secretions easily. Mild R sided facial swelling.   Eyes: Conjunctivae and EOM are normal. Pupils are equal, round, and reactive to light.  Neck: Normal range of motion.  Cardiovascular: Normal rate, regular rhythm and intact distal pulses.  Pulmonary/Chest: Effort normal and breath sounds normal. She has no decreased breath sounds. She has no wheezes. She has no rhonchi. She has no rales.  Pt speaking in full sentences without difficulty.  Clear lung sounds in all fields  Abdominal: Soft. She exhibits no distension. There is no tenderness.  Musculoskeletal: Normal range of motion.  Lymphadenopathy:    She has no cervical adenopathy.  Neurological: She is alert and oriented to person, place, and time.  Skin: Skin is warm.  Psychiatric: She has a normal mood and affect.  Nursing note and vitals reviewed.    ED Treatments / Results  Labs (all labs ordered are listed, but only abnormal results are displayed) Labs Reviewed  RAPID  STREP SCREEN (NOT AT [facility]) - Abnormal; Notable for the following components:      Result Value   Streptococcus, Group A Screen (Direct) POSITIVE (*)    All other components within normal limits    EKG  EKG Interpretation None       Radiology No results found.  Procedures Procedures (including critical care time)  Medications Ordered in ED Medications - No data to display   Initial Impression / Assessment and Plan / ED Course  I have reviewed the triage vital signs and the nursing notes.  Pertinent labs & imaging results that were available during my care of the patient were reviewed by me and considered in my medical decision making (see  chart for details).     Patient presenting for evaluation of sore throat.  Physical exam shows right sided swollen tonsil without signs of peritonsillar abscess or Ludwig's.  Physical exam otherwise reassuring, she is afebrile, not tachycardic.  She appears nontoxic.  Strep test positive.  Will treat for strep throat and give patient information for ear nose and throat for follow-up if symptoms worsen and are c/w peritonsillar abscess.  At this time, patient appears safe for discharge.  Return precautions given.  Patient states she understands and agrees to plan.   Final Clinical Impressions(s) / ED Diagnoses   Final diagnoses:  Strep pharyngitis    ED Discharge Orders        Ordered    clindamycin (CLEOCIN) 300 MG capsule  3 times daily     09/08/17 1106    ibuprofen (ADVIL,MOTRIN) 800 MG tablet  3 times daily with meals     09/08/17 1106    phenol (CHLORASEPTIC) 1.4 % LIQD  As needed     09/08/17 1106       Weslynn Ke, PA-C 09/08/17 1706    Mackuen, Cindee Saltourteney Lyn, MD 09/13/17 1116

## 2017-09-08 NOTE — Discharge Instructions (Signed)
Take antibiotics as prescribed.  Take the entire course of antibiotics, even if your symptoms improve. Take ibuprofen 3 times a day with meals.  This will help with pain and swelling.  Do not take other anti-inflammatories at the same time. Use a sore throat spray as needed for pain. Make sure you stay well-hydrated with water. Wash your hands frequently to prevent spread of infection. If you are not tolerating the antibiotic, contact your primary care doctor for change in antibiotics. If your symptoms worsen after 24 hours of being on antibiotics, contact the ear nose and throat doctor or return to the emergency room for further evaluation.  Return to the emergency room if you develop difficulty swallowing, inability to open your mouth, worsening swelling, or any new or worsening symptoms.

## 2017-09-08 NOTE — ED Triage Notes (Signed)
Patient reports that she has had a sore throat x 2 days  - hurts to talk and swallow

## 2017-10-22 ENCOUNTER — Encounter (HOSPITAL_BASED_OUTPATIENT_CLINIC_OR_DEPARTMENT_OTHER): Payer: Self-pay | Admitting: *Deleted

## 2017-10-22 ENCOUNTER — Emergency Department (HOSPITAL_BASED_OUTPATIENT_CLINIC_OR_DEPARTMENT_OTHER)
Admission: EM | Admit: 2017-10-22 | Discharge: 2017-10-22 | Disposition: A | Discharge status: Home / Self Care | Payer: Medicaid Other | Attending: Emergency Medicine | Admitting: Emergency Medicine

## 2017-10-22 DIAGNOSIS — J45909 Unspecified asthma, uncomplicated: Secondary | ICD-10-CM | POA: Diagnosis not present

## 2017-10-22 DIAGNOSIS — Z79899 Other long term (current) drug therapy: Secondary | ICD-10-CM | POA: Diagnosis not present

## 2017-10-22 DIAGNOSIS — K0889 Other specified disorders of teeth and supporting structures: Secondary | ICD-10-CM | POA: Diagnosis present

## 2017-10-22 DIAGNOSIS — Z87891 Personal history of nicotine dependence: Secondary | ICD-10-CM | POA: Diagnosis not present

## 2017-10-22 MED ORDER — OXYCODONE-ACETAMINOPHEN 5-325 MG PO TABS
1.0000 | ORAL_TABLET | Freq: Once | ORAL | Status: AC
Start: 2017-10-22 — End: 2017-10-22
  Administered 2017-10-22: 1 via ORAL
  Filled 2017-10-22: qty 1

## 2017-10-22 MED ORDER — IBUPROFEN 200 MG PO TABS
600.0000 mg | ORAL_TABLET | Freq: Once | ORAL | Status: AC
  Administered 2017-10-22: 11:00:00 600 mg via ORAL
  Filled 2017-10-22: qty 1

## 2017-10-22 MED ORDER — CLINDAMYCIN HCL 300 MG PO CAPS: 300.0000 mg | ORAL_CAPSULE | Freq: Four times a day (QID) | ORAL | 0 refills | Status: DC

## 2017-10-22 MED ORDER — BENZOCAINE 10 % MT GEL: 1.0000 "application " | OROMUCOSAL | 0 refills | Status: DC | PRN

## 2017-10-22 MED ORDER — IBUPROFEN 600 MG PO TABS: 600.0000 mg | ORAL_TABLET | Freq: Four times a day (QID) | ORAL | 0 refills | Status: DC | PRN

## 2017-10-22 NOTE — ED Provider Notes (Signed)
[facility] EMERGENCY DEPARTMENT Provider Note   CSN: [id number] Arrival date & time: 10/22/17  1016     History   Chief Complaint Chief Complaint  Patient presents with  . Oral Swelling    HPI  Lisa Bond is a 27 y.o. Female with a history of asthma, presents to the ED for evaluation of dental pain.  Patient reports right lower dental pain with some swelling on her lower jaw, reports pain began yesterday.  Reports she had a broken tooth in this area that happened a couple months ago but has not been giving her any trouble so she is been waiting to see a dentist, yesterday started having pain and swelling over the area.  Patient denies any swelling or tenderness under the tongue, no difficulty breathing, no swelling or pain in the neck.  No fevers or chills, no nausea or vomiting.  Patient has been taking 800 mg of ibuprofen, she also had which she has been taking.  She reports pain is unrelieved with ibuprofen, has not tried anything else to treat her symptoms.  Patient does not have a regular dentist but plans to follow-up with one on Monday.       Past Medical History:  Diagnosis Date  . Asthma    last inhaler 3 days ago  . Chlamydia   . GERD (gastroesophageal reflux disease)   . Gonorrhea   . Infection 2012   GC, chlamydia    Patient Active Problem List   Diagnosis Date Noted  . Status post repeat low transverse cesarean section 03/10/2017  . Preeclampsia, third trimester 03/08/2017    Past Surgical History:  Procedure Laterality Date  . CESAREAN SECTION N/A 03/10/2017   Procedure: CESAREAN SECTION;  Surgeon: Essie Hart, MD;  Location: [facility] BIRTHING SUITES;  Service: Obstetrics;  Laterality: N/A;  . KNEE SURGERY     left    OB History    Gravida Para Term Preterm AB Living   1 1 1     1    SAB TAB Ectopic Multiple Live Births         0 1       Home Medications    Prior to Admission medications   Medication Sig Start Date End Date Taking?  Authorizing Provider  acetaminophen (TYLENOL) 500 MG tablet Take 2 tablets (1,000 mg total) by mouth every 6 (six) hours as needed for mild pain or moderate pain. Patient not taking: Reported on 03/08/2017 08/08/16   Katrinka Blazing [place], CNM  albuterol (PROVENTIL HFA) [phone] Base) MCG/ACT inhaler Inhale 1-2 puffs into the lungs every 6 (six) hours as needed for shortness of breath.     [provider]  benzocaine (ORAJEL) 10 % mucosal gel Use as directed 1 application in the mouth or throat as needed for mouth pain. 10/22/17   Dartha Lodge, PA-C  clindamycin (CLEOCIN) 300 MG capsule Take 1 capsule (300 mg total) by mouth 4 (four) times daily. X 7 days 10/22/17   Dartha Lodge, PA-C  fluticasone-salmeterol (ADVAIR HFA) 230-21 MCG/ACT inhaler Inhale 2 puffs into the lungs daily as needed (SOB).     [provider]  ibuprofen (ADVIL,MOTRIN) 600 MG tablet Take 1 tablet (600 mg total) by mouth every 6 (six) hours as needed. 10/22/17   Dartha Lodge, PA-C  ibuprofen (ADVIL,MOTRIN) 800 MG tablet Take 1 tablet (800 mg total) by mouth 3 (three) times daily with meals. 09/08/17   Caccavale, Sophia, PA-C  oxyCODONE-acetaminophen (PERCOCET/ROXICET) 5-325  MG tablet Take 2 tablets by mouth every 4 (four) hours as needed (pain scale > 7). 03/13/17   Philip Aspen, DO  phenol (CHLORASEPTIC) 1.4 % LIQD Use as directed 1 spray in the mouth or throat as needed for throat irritation / pain. 09/08/17   Caccavale, Sophia, PA-C  Prenat w/o A Vit-FeFum-FePo-FA (CONCEPT OB) 130-92.4-1 MG CAPS Take 1 tablet by mouth daily. 08/08/16   Katrinka Blazing, [place], CNM  ranitidine (ZANTAC) 150 MG tablet Take 150 mg by mouth 2 (two) times daily.    [provider]    Family History Family History  Problem Relation Age of Onset  . Heart disease Father     Social History Social History   Tobacco Use  . Smoking status: Former Smoker    Packs/day: 0.25    Years: 2.00    Pack years: 0.50    Types: Cigarettes   . Smokeless tobacco: Never Used  Substance Use Topics  . Alcohol use: No  . Drug use: Yes    Types: Marijuana     Allergies   Amoxicillin and Penicillins   Review of Systems Review of Systems  Constitutional: Negative for chills and fever.  HENT: Positive for dental problem and facial swelling. Negative for drooling, trouble swallowing and voice change.   Eyes: Negative for pain.  Skin: Negative for color change and rash.     Physical Exam Updated Vital Signs BP 125/84 (BP Location: Left Arm)   Pulse 94   Temp 97.7 F (36.5 C) (Oral)   Resp 18   Ht 5\' 3"  (1.6 m)   Wt 68 kg (150 lb)   SpO2 100%   BMI 26.57 kg/m   Physical Exam  Constitutional: She appears well-developed and well-nourished. No distress.  HENT:  Head: Normocephalic and atraumatic.  Mouth/Throat:    Teeth in somewhat poor dentition, pain over right lower gums, no obvious abscess noted there is some erythema and swelling of the gums, no tenderness or swelling Of the sublingual tissue, posterior oropharynx is clear and moist, anterior and posterior arches visible, no airway compromise. Some facial swelling over the lower right jaw, this does not extend up to the cheek or towards the eye  Eyes: EOM are normal. Pupils are equal, round, and reactive to light. Right eye exhibits no discharge. Left eye exhibits no discharge.  No periorbital swelling or pain with extraocular movements  Neck:  Tenderness to palpation over the right side of the neck, no appreciable swelling, normal range of motion  Pulmonary/Chest: Effort normal. No respiratory distress.  Neurological: She is alert. Coordination normal.  Skin: Skin is warm and dry. Capillary refill takes less than 2 seconds. She is not diaphoretic.  Psychiatric: She has a normal mood and affect. Her behavior is normal.  Nursing note and vitals reviewed.    ED Treatments / Results  Labs (all labs ordered are listed, but only abnormal results are  displayed) Labs Reviewed - No data to display  EKG  EKG Interpretation None       Radiology No results found.  Procedures Procedures (including critical care time)  Medications Ordered in ED Medications  oxyCODONE-acetaminophen (PERCOCET/ROXICET) 5-325 MG per tablet 1 tablet (1 tablet Oral Given 10/22/17 1056)  ibuprofen (ADVIL,MOTRIN) tablet 600 mg (600 mg Oral Given 10/22/17 1055)     Initial Impression / Assessment and Plan / ED Course  I have reviewed the triage vital signs and the nursing notes.  Pertinent labs & imaging results that were available during  my care of the patient were reviewed by me and considered in my medical decision making (see chart for details).  Patient with toothache.  Mild swelling over right lower jaw. No gross abscess.  Fevers or chills. exam unconcerning for Ludwig's angina or spread of infection.  Plied topical anesthetic, will give 1 dose of Percocet here to try and get pain under control but patient is aware she will not receive prescription for this at discharge.  Will discharge with penicillin and anti-inflammatories medicine, patient also given prescription for topical benzocaine, and work note provided.  Urged patient to follow-up with dentist, dental resources provided.   Final Clinical Impressions(s) / ED Diagnoses   Final diagnoses:  Pain, dental  Toothache    ED Discharge Orders        Ordered    clindamycin (CLEOCIN) 300 MG capsule  4 times daily     10/22/17 1048    ibuprofen (ADVIL,MOTRIN) 600 MG tablet  Every 6 hours PRN     10/22/17 1048    benzocaine (ORAJEL) 10 % mucosal gel  As needed     10/22/17 1052       Dartha LodgeFord, Kayzen Kendzierski N, [place]PA-C 10/22/17 1123    Terrilee FilesButler, Michael C, MD 10/23/17 1553

## 2017-10-22 NOTE — Discharge Instructions (Signed)
Please complete entire course of antibiotics unless your dentist tells you otherwise.  Continue to treat pain with ibuprofen and Tylenol, you can also alternate ice and heat over the area.  Please use the resources provided to obtain follow-up with a dentist for management of a broken tooth.  If you have swelling or pain under the tongue, fevers or chills, difficulty breathing, or facial swelling that spreads up towards the eye or into the neck please return to the ED for reevaluation.

## 2017-10-22 NOTE — ED Triage Notes (Signed)
Pt is here with right lower dental pain.  Pt reports that the pain began yesterday.  Some swelling on her lower jaw noted.  No fever with this.  Pt states that she needs something to hold her over until she can see a DDS on Monday as her pain has been unrelieved by 800mg  ibuprofen.  Pt has been taking a old antibiotic

## 2017-10-24 ENCOUNTER — Other Ambulatory Visit: Payer: Self-pay

## 2017-10-24 ENCOUNTER — Emergency Department (HOSPITAL_BASED_OUTPATIENT_CLINIC_OR_DEPARTMENT_OTHER)
Admission: EM | Admit: 2017-10-24 | Discharge: 2017-10-24 | Discharge status: Against Medical Advice | Payer: Medicaid Other | Attending: Emergency Medicine | Admitting: Emergency Medicine

## 2017-10-24 ENCOUNTER — Encounter (HOSPITAL_BASED_OUTPATIENT_CLINIC_OR_DEPARTMENT_OTHER): Payer: Self-pay | Admitting: *Deleted

## 2017-10-24 DIAGNOSIS — Z5321 Procedure and treatment not carried out due to patient leaving prior to being seen by health care provider: Secondary | ICD-10-CM | POA: Diagnosis not present

## 2017-10-24 DIAGNOSIS — K0889 Other specified disorders of teeth and supporting structures: Secondary | ICD-10-CM | POA: Diagnosis present

## 2017-10-24 NOTE — ED Triage Notes (Signed)
Dental abscess. She was seen for same 2 days ago. States she needs pain medication.

## 2017-10-24 NOTE — ED Notes (Signed)
Patient sitting at the end of the bed. Out to the nurses station again asking for something for pain. States that she took motrin Prior to coming in. Patient states that the pain is so bad that she may throw up. Patient updated about the wait and the reason for. Patient picks up her keys and states that she will go somewhere else where she will get quicker care.

## 2018-08-14 ENCOUNTER — Encounter (HOSPITAL_BASED_OUTPATIENT_CLINIC_OR_DEPARTMENT_OTHER): Payer: Self-pay

## 2018-08-14 ENCOUNTER — Emergency Department (HOSPITAL_BASED_OUTPATIENT_CLINIC_OR_DEPARTMENT_OTHER)
Admission: EM | Admit: 2018-08-14 | Discharge: 2018-08-14 | Disposition: A | Discharge status: Home / Self Care | Payer: Self-pay | Attending: Emergency Medicine | Admitting: Emergency Medicine

## 2018-08-14 ENCOUNTER — Other Ambulatory Visit: Payer: Self-pay

## 2018-08-14 ENCOUNTER — Emergency Department (HOSPITAL_BASED_OUTPATIENT_CLINIC_OR_DEPARTMENT_OTHER): Payer: Self-pay

## 2018-08-14 DIAGNOSIS — Z79899 Other long term (current) drug therapy: Secondary | ICD-10-CM | POA: Insufficient documentation

## 2018-08-14 DIAGNOSIS — J45909 Unspecified asthma, uncomplicated: Secondary | ICD-10-CM | POA: Insufficient documentation

## 2018-08-14 DIAGNOSIS — N3001 Acute cystitis with hematuria: Secondary | ICD-10-CM

## 2018-08-14 DIAGNOSIS — Z3A10 10 weeks gestation of pregnancy: Secondary | ICD-10-CM | POA: Insufficient documentation

## 2018-08-14 DIAGNOSIS — R102 Pelvic and perineal pain: Secondary | ICD-10-CM | POA: Insufficient documentation

## 2018-08-14 DIAGNOSIS — O9989 Other specified diseases and conditions complicating pregnancy, childbirth and the puerperium: Secondary | ICD-10-CM | POA: Insufficient documentation

## 2018-08-14 DIAGNOSIS — B373 Candidiasis of vulva and vagina: Secondary | ICD-10-CM

## 2018-08-14 DIAGNOSIS — R1084 Generalized abdominal pain: Secondary | ICD-10-CM

## 2018-08-14 DIAGNOSIS — Z87891 Personal history of nicotine dependence: Secondary | ICD-10-CM | POA: Insufficient documentation

## 2018-08-14 DIAGNOSIS — O2310 Infections of bladder in pregnancy, unspecified trimester: Secondary | ICD-10-CM | POA: Insufficient documentation

## 2018-08-14 DIAGNOSIS — B3731 Acute candidiasis of vulva and vagina: Secondary | ICD-10-CM

## 2018-08-14 DIAGNOSIS — O23591 Infection of other part of genital tract in pregnancy, first trimester: Secondary | ICD-10-CM | POA: Insufficient documentation

## 2018-08-14 LAB — COMPREHENSIVE METABOLIC PANEL
ALT: 18 U/L (ref 0–44)
AST: 15 U/L (ref 15–41)
Albumin: 4 g/dL (ref 3.5–5.0)
Alkaline Phosphatase: 45 U/L (ref 38–126)
Anion gap: 10 (ref 5–15)
BUN: 13 mg/dL (ref 6–20)
CHLORIDE: 105 mmol/L (ref 98–111)
CO2: 20 mmol/L — AB (ref 22–32)
Calcium: 9 mg/dL (ref 8.9–10.3)
Creatinine, Ser: 0.46 mg/dL (ref 0.44–1.00)
GFR calc non Af Amer: >60 mL/min (ref >60)
Glucose, Bld: 88 mg/dL (ref 70–99)
Potassium: 3.9 mmol/L (ref 3.5–5.1)
Sodium: 135 mmol/L (ref 135–145)
Total Bilirubin: 0.5 mg/dL (ref 0.3–1.2)
Total Protein: 7.3 g/dL (ref 6.5–8.1)

## 2018-08-14 LAB — URINALYSIS, ROUTINE W REFLEX MICROSCOPIC
Bilirubin Urine: NEGATIVE
GLUCOSE, UA: NEGATIVE mg/dL
Hgb urine dipstick: NEGATIVE
KETONES UR: NEGATIVE mg/dL
Nitrite: NEGATIVE
PH: 6 (ref 5.0–8.0)
Protein, ur: NEGATIVE mg/dL
SPECIFIC GRAVITY, URINE: 1.025 (ref 1.005–1.030)

## 2018-08-14 LAB — WET PREP, GENITAL
CLUE CELLS WET PREP: NONE SEEN
Sperm: NONE SEEN
Trich, Wet Prep: NONE SEEN

## 2018-08-14 LAB — CBC
HEMATOCRIT: 40.6 % (ref 36.0–46.0)
Hemoglobin: 13.5 g/dL (ref 12.0–15.0)
MCH: 29.6 pg (ref 26.0–34.0)
MCHC: 33.3 g/dL (ref 30.0–36.0)
MCV: 89 fL (ref 80.0–100.0)
NRBC: 0 % (ref 0.0–0.2)
Platelets: 212 10*3/uL (ref 150–400)
RBC: 4.56 MIL/uL (ref 3.87–5.11)
RDW: 12.8 % (ref 11.5–15.5)
WBC: 7.2 10*3/uL (ref 4.0–10.5)

## 2018-08-14 LAB — HCG, QUANTITATIVE, PREGNANCY: HCG, BETA CHAIN, QUANT, S: 147057 m[IU]/mL — AB (ref <5)

## 2018-08-14 LAB — URINALYSIS, MICROSCOPIC (REFLEX)

## 2018-08-14 LAB — PREGNANCY, URINE: Preg Test, Ur: POSITIVE — AB

## 2018-08-14 LAB — LIPASE, BLOOD: Lipase: 22 U/L (ref 11–51)

## 2018-08-14 MED ORDER — CEPHALEXIN 500 MG PO CAPS: 500.0000 mg | ORAL_CAPSULE | Freq: Two times a day (BID) | ORAL | 0 refills | Status: AC

## 2018-08-14 MED ORDER — CLOTRIMAZOLE 1 % VA CREA: 1.0000 | TOPICAL_CREAM | Freq: Every day | VAGINAL | 0 refills | Status: AC

## 2018-08-14 MED FILL — CEPHALEXIN 500 MG CAPSULE: 500 | 7 days supply | Qty: 14 | Fill #0

## 2018-08-14 MED FILL — CLOTRIM 1% VAGINAL CREAM: 1 | 7 days supply | Qty: 45 | Fill #0

## 2018-08-14 NOTE — Discharge Instructions (Addendum)
Please take the antibiotic as recommended on your discharge paperwork.  A culture was sent of your urine today to determine if there is any bacterial growth. If the results of the culture are positive and you require an antibiotic or a change of your prescribed antibiotic you will be contacted by the hospital. If the results are negative you will not be contacted.  You have been tested for HIV, syphilis, chlamydia and gonorrhea.  These results will be available in approximately 3 days and you will be contacted by the hospital if the results are positive. Avoid sexual contact until you are aware of the results, and please inform all sexual partners if you test positive for any of these diseases.  You will need to start taking prenatal vitamins daily.  You also need to make an appointment with an OB/GYN for follow-up.  Please return to the emergency department for continued abdominal pain, vaginal bleeding, new symptoms, or any worsening of your symptoms.

## 2018-08-14 NOTE — ED Provider Notes (Signed)
[facility] EMERGENCY DEPARTMENT Provider Note   CSN: [id number] Arrival date & time: 08/14/18  [id number]     History   Chief Complaint Chief Complaint  Patient presents with  . Abdominal Pain    HPI Lisa Bond is a 27 y.o. female.  HPI   Pt is a 27 y/o female with a h/o asthma, chlamydia, gonorrhea, GERD, [id number] who presents to the ED today c/o lower abd pain that began about 4 days ago. States she has had cramping and pressure that has been constant. Rates pain 7/10 but states that it has improved somewhat since onset. She also reports nausea, vomiting, constipation, and vaginal spotting. Denies fevers, chills, diarrhea, dysuria, urgency, vaginal discharge.   LMP 06/01/18. States she took a pregnancy test last week and it was positive.     Past Medical History:  Diagnosis Date  . Asthma    last inhaler 3 days ago  . Chlamydia   . GERD (gastroesophageal reflux disease)   . Gonorrhea   . Infection 2012   GC, chlamydia    Patient Active Problem List   Diagnosis Date Noted  . Status post repeat low transverse cesarean section 03/10/2017  . Preeclampsia, third trimester 03/08/2017    Past Surgical History:  Procedure Laterality Date  . CESAREAN SECTION Bond/A 03/10/2017   Procedure: CESAREAN SECTION;  Surgeon: Lisa Bond, Walda, MD;  [facility];  Service: Obstetrics;  Laterality: Bond/A;  . KNEE SURGERY     left     OB History    Gravida  2   Para  1   Term  1   Preterm      AB      Living  1     SAB      TAB      Ectopic      Multiple  0   Live Births  1            Home Medications    Prior to Admission medications   Medication Sig Start Date End Date Taking? Authorizing Provider  acetaminophen (TYLENOL) 500 MG tablet Take 2 tablets (1,000 mg total) by mouth every 6 (six) hours as needed for mild pain or moderate pain. Patient not taking: Reported on 03/08/2017 08/08/16   Lisa BlazingSmith, [place], Lisa Bond  albuterol (PROVENTIL HFA)  [phone](90 Base) MCG/ACT inhaler Inhale 1-2 puffs into the lungs every 6 (six) hours as needed for shortness of breath.     [provider]  benzocaine (ORAJEL) 10 % mucosal gel Use as directed 1 application in the mouth or throat as needed for mouth pain. 10/22/17   Lisa Bond, Lisa N, PA-C  cephALEXin (KEFLEX) 500 MG capsule Take 1 capsule (500 mg total) by mouth 2 (two) times daily for 7 days. 08/14/18 08/21/18  Lisa Bond S, PA-C  clindamycin (CLEOCIN) 300 MG capsule Take 1 capsule (300 mg total) by mouth 4 (four) times daily. X 7 days 10/22/17   Lisa Bond, Lisa N, PA-C  clotrimazole (GYNE-LOTRIMIN) 1 % vaginal cream Place 1 Applicatorful vaginally at bedtime for 7 days. 08/14/18 08/21/18  Lisa Bond S, PA-C  fluticasone-salmeterol (ADVAIR HFA) 230-21 MCG/ACT inhaler Inhale 2 puffs into the lungs daily as needed (SOB).     [provider]  ibuprofen (ADVIL,MOTRIN) 600 MG tablet Take 1 tablet (600 mg total) by mouth every 6 (six) hours as needed. 10/22/17   Lisa Bond, Lisa N, PA-C  ibuprofen (ADVIL,MOTRIN) 800 MG tablet Take 1 tablet (800 mg total)  by mouth 3 (three) times daily with meals. 09/08/17   Lisa Bond, Sophia, PA-C  oxyCODONE-acetaminophen (PERCOCET/ROXICET) 5-325 MG tablet Take 2 tablets by mouth every 4 (four) hours as needed (pain scale > 7). 03/13/17   Lisa Aspen, DO  phenol (CHLORASEPTIC) 1.4 % LIQD Use as directed 1 spray in the mouth or throat as needed for throat irritation / pain. 09/08/17   Lisa Bond, Sophia, PA-C  Prenat w/o A Vit-FeFum-FePo-FA (CONCEPT OB) 130-92.4-1 MG CAPS Take 1 tablet by mouth daily. 08/08/16   Lisa Bond, Lisa, Lisa Bond  ranitidine (ZANTAC) 150 MG tablet Take 150 mg by mouth 2 (two) times daily.    [provider]    Family History Family History  Problem Relation Age of Onset  . Heart disease Father     Social History Social History   Tobacco Use  . Smoking status: Former Smoker    Packs/day: 0.25    Years: 2.00    Pack  years: 0.50    Types: Cigarettes  . Smokeless tobacco: Never Used  Substance Use Topics  . Alcohol use: No  . Drug use: Yes    Types: Marijuana    Comment: used every day prior to pregnancy     Allergies   Amoxicillin and Penicillins   Review of Systems Review of Systems  Constitutional: Negative for chills and fever.  HENT: Negative for congestion, rhinorrhea and sore throat.   Eyes: Negative for visual disturbance.  Respiratory: Positive for cough. Negative for shortness of breath.   Cardiovascular: Negative for chest pain.  Gastrointestinal: Positive for abdominal pain, nausea and vomiting. Negative for constipation and diarrhea.  Genitourinary: Negative for dysuria, hematuria and urgency.  Musculoskeletal: Negative for back pain.  Skin: Negative for rash.  Neurological: Negative for headaches.     Physical Exam Updated Vital Signs BP 122/78 (BP Location: Right Arm)   Pulse 85   Temp 98.1 F (36.7 C) (Oral)   Resp 18   Ht 5\' 2"  (1.575 m)   Wt 65.8 kg   LMP 06/01/2018 (Exact Date)   SpO2 100%   Breastfeeding? No   BMI 26.52 kg/m   Physical Exam  Constitutional: She appears well-developed and well-nourished.  Non-toxic appearance. She does not appear ill. No distress.  HENT:  Head: Normocephalic and atraumatic.  Eyes: Conjunctivae are normal.  Neck: Neck supple.  Cardiovascular: Normal rate, regular rhythm, normal heart sounds and intact distal pulses.  No murmur heard. Pulmonary/Chest: Effort normal and breath sounds normal. No stridor. No respiratory distress. She has no wheezes. She has no rales.  Abdominal: Soft. There is tenderness in the suprapubic area. There is no rigidity, no rebound, no guarding and no CVA tenderness.  Genitourinary:  Genitourinary Comments: Exam performed by Karrie Meres,  exam chaperoned Date: 08/14/2018 Pelvic exam: normal external genitalia without evidence of trauma. VULVA: normal appearing vulva with no masses,  tenderness or lesion. VAGINA: normal appearing vagina with normal color and discharge, no lesions. CERVIX: normal appearing cervix without lesions, cervical motion tenderness absent, cervical os closed with out purulent discharge; vaginal discharge present that is white and chunky. Wet prep and DNA probe for chlamydia and GC obtained.   ADNEXA: normal adnexa in size, mild right adnexal ttp UTERUS: uterus is normal size, shape, consistency. Uterus is mildly tender.   Musculoskeletal: She exhibits no edema.  Neurological: She is alert.  Skin: Skin is warm and dry.  Psychiatric: She has a normal mood and affect.  Nursing note and vitals reviewed.  ED Treatments / Results  Labs (all labs ordered are listed, but only abnormal results are displayed) Labs Reviewed  WET PREP, GENITAL - Abnormal; Notable for the following components:      Result Value   Yeast Wet Prep HPF POC PRESENT (*)    WBC, Wet Prep HPF POC MANY (*)    All other components within normal limits  COMPREHENSIVE METABOLIC PANEL - Abnormal; Notable for the following components:   CO2 20 (*)    All other components within normal limits  URINALYSIS, ROUTINE W REFLEX MICROSCOPIC - Abnormal; Notable for the following components:   APPearance CLOUDY (*)    Leukocytes, UA SMALL (*)    All other components within normal limits  PREGNANCY, URINE - Abnormal; Notable for the following components:   Preg Test, Ur POSITIVE (*)    All other components within normal limits  URINALYSIS, MICROSCOPIC (REFLEX) - Abnormal; Notable for the following components:   Bacteria, UA MANY (*)    All other components within normal limits  HCG, QUANTITATIVE, PREGNANCY - Abnormal; Notable for the following components:   hCG, Beta Chain, Quant, S 147,057 (*)    All other components within normal limits  URINE CULTURE  LIPASE, BLOOD  CBC  GC/CHLAMYDIA PROBE AMP ([place]) NOT AT [facility]    EKG None  Radiology US Ob Comp < 14 Wks  Result  Date: 08/14/2018 CLINICAL DATA:  27 year old pregnant female with acute pelvic pain for 3 days. Estimated gestational age of [redacted] weeks 4 days by LMP. EXAM: OBSTETRIC <14 WK ULTRASOUND TECHNIQUE: Transabdominal ultrasound was performed for evaluation of the gestation as well as the maternal uterus and adnexal regions. COMPARISON:  None. FINDINGS: Intrauterine gestational sac: Single Yolk sac:  Visualized. Embryo:  Visualized. Cardiac Activity: Visualized. Heart Rate: 168 bpm CRL:   31.9 mm   10 w 0 d                  [place] EDC: 03/12/2019 Subchorionic hemorrhage:  A small subchorionic hemorrhage is noted. Maternal uterus/adnexae: The ovaries are unremarkable. No free fluid or adnexal mass. IMPRESSION: 1. Single living intrauterine gestation with estimated gestational age of [redacted] weeks 0 days by this ultrasound. 2. Small subchorionic hemorrhage Electronically Signed   By: Harmon Pier M.D.   On: 08/14/2018 12:36    Procedures Procedures (including critical care time)  Medications Ordered in ED Medications - No data to display   Initial Impression / Assessment and Plan / ED Course  I have reviewed the triage vital signs and the nursing notes.  Pertinent labs & imaging results that were available during my care of the patient were reviewed by me and considered in my medical decision making (see chart for details).   7:32 PM contacted pt by telephone to inform her of the results of the ultrasound findings. She voices an understanding of the results. She plans to follow up with ob-gyn ASAP.  Final Clinical Impressions(s) / ED Diagnoses   Final diagnoses:  Generalized abdominal pain  Acute cystitis with hematuria  Vaginal candidiasis   Patient presenting with lower abdominal pain in the setting of recent pregnancy diagnosis.  Has not had an ultrasound to confirm intrauterine pregnancy.  Has had some vaginal spotting.  Afebrile.  No urinary symptoms.  On exam she has some bilateral lower abdominal  tenderness.  On pelvic exam she has uterine and right adnexal tenderness.  No cervical motion tenderness.  She does have some discharge in the vaginal vault  but no obvious bleeding.  Cervical os closed.  Lab work is reassuring with no elevated white blood cell count or anemia.  CMP and lipase within normal limits.  UA with leukocytes, 11-20 white blood cells, and many bacteria.  No squamous epithelial cells noted.  Urine was sent for culture.  Wet prep and GC chlamydia were obtained.  Wet preps showed evidence of a yeast infection.  Given patient's urinalysis is suggestive of a urinary tract infection we will treat her with Keflex.  Will also give her clotrimazole cream to treat the yeast infection.  Attempted to obtain pelvic ultrasound.  Patient voiced that she would like to leave as her son's daycare contacted her and said that he had an accident and she needed to come pick him up.  She was able to stay for completion of the ultrasound but she was not able to stay for the results.  I discussed the risks of leaving without the results given that we are unable to rule out ectopic pregnancy or other intra-abdominal pathology at this time.  She voices understanding of the risks and chooses to leave against medical advice.  She requested that I contact her with the results of the ultrasound.  I advised that I would contact her and that she may choose to return to the emergency department if any new or worsening symptoms develop.  Also encouraged her to start taking prenatal vitamins and get established with an OB/GYN which she states she will do.  Contacted patient as above in regards to ultrasound findings.  She plans to follow up with OB/GYN ASAP.  She agrees to return for any new or worsening symptoms.  ED Discharge Orders         Ordered    cephALEXin (KEFLEX) 500 MG capsule  2 times daily     08/14/18 1219    clotrimazole (GYNE-LOTRIMIN) 1 % vaginal cream  Daily at bedtime     08/14/18 1219            Karrie Meres, PA-C 08/14/18 1948    Tegeler, Canary Brim, MD 08/15/18 1109

## 2018-08-14 NOTE — ED Triage Notes (Signed)
Pt arrives via POV with c/o abd pain x 3 days. Found out she was pregnant a week ago. LMP: 06/01/18. Confirms some spotting, nausea.AOx4, ambulatory.

## 2018-08-14 NOTE — ED Notes (Signed)
Pt requesting to leave, states "I have to go to my son's daycare." PA made aware.

## 2018-08-15 LAB — URINE CULTURE

## 2018-08-15 LAB — GC/CHLAMYDIA PROBE AMP (~~LOC~~) NOT AT ARMC
CHLAMYDIA, DNA PROBE: NEGATIVE
NEISSERIA GONORRHEA: NEGATIVE

## 2018-11-25 ENCOUNTER — Encounter (HOSPITAL_BASED_OUTPATIENT_CLINIC_OR_DEPARTMENT_OTHER): Payer: Self-pay | Admitting: *Deleted

## 2018-11-25 ENCOUNTER — Emergency Department (HOSPITAL_BASED_OUTPATIENT_CLINIC_OR_DEPARTMENT_OTHER)
Admission: EM | Admit: 2018-11-25 | Discharge: 2018-11-26 | Disposition: A | Discharge status: Home / Self Care | Payer: Medicaid Other | Attending: Emergency Medicine | Admitting: Emergency Medicine

## 2018-11-25 ENCOUNTER — Other Ambulatory Visit: Payer: Self-pay

## 2018-11-25 DIAGNOSIS — J45909 Unspecified asthma, uncomplicated: Secondary | ICD-10-CM | POA: Insufficient documentation

## 2018-11-25 DIAGNOSIS — J020 Streptococcal pharyngitis: Secondary | ICD-10-CM | POA: Insufficient documentation

## 2018-11-25 DIAGNOSIS — F17290 Nicotine dependence, other tobacco product, uncomplicated: Secondary | ICD-10-CM | POA: Insufficient documentation

## 2018-11-25 DIAGNOSIS — Z79899 Other long term (current) drug therapy: Secondary | ICD-10-CM | POA: Insufficient documentation

## 2018-11-25 LAB — GROUP A STREP BY PCR: Group A Strep by PCR: DETECTED — AB

## 2018-11-25 MED ORDER — CLINDAMYCIN HCL 150 MG PO CAPS
300.0000 mg | ORAL_CAPSULE | Freq: Once | ORAL | Status: AC
  Administered 2018-11-26: 300 mg via ORAL
  Filled 2018-11-25: qty 2

## 2018-11-25 MED ORDER — DEXAMETHASONE 6 MG PO TABS
10.0000 mg | ORAL_TABLET | Freq: Once | ORAL | Status: AC
  Administered 2018-11-26: 10 mg via ORAL
  Filled 2018-11-25: qty 1

## 2018-11-25 NOTE — ED Triage Notes (Signed)
Sore throat x 1 day 

## 2018-11-25 NOTE — ED Provider Notes (Signed)
[facility] EMERGENCY DEPARTMENT Provider Note  CSN: [id number] Arrival date & time: 11/25/18 2309  Chief Complaint(s) Sore Throat  HPI Lisa Bond is a 28 y.o. female who presents to the emergency department with 1 day of sore throat.  She denies any fevers but endorses chills.  Sore throat is exacerbated with swallowing and smoking.  No nausea or vomiting.  No coughing.  No chest pain or shortness of breath.  No abdominal pain.  No diarrhea.  No urinary symptoms.  Reports that her son has an external ear infection.  No other known sick contacts  HPI  Past Medical History Past Medical History:  Diagnosis Date  . Asthma    last inhaler 3 days ago  . Chlamydia   . GERD (gastroesophageal reflux disease)   . Gonorrhea   . Infection 2012   GC, chlamydia   Patient Active Problem List   Diagnosis Date Noted  . Status post repeat low transverse cesarean section 03/10/2017  . Preeclampsia, third trimester 03/08/2017   Home Medication(s) Prior to Admission medications   Medication Sig Start Date End Date Taking? Authorizing Provider  acetaminophen (TYLENOL) 500 MG tablet Take 2 tablets (1,000 mg total) by mouth every 6 (six) hours as needed for mild pain or moderate pain. Patient not taking: Reported on 03/08/2017 08/08/16   Katrinka Blazing [place], CNM  albuterol (PROVENTIL HFA) [phone] Base) MCG/ACT inhaler Inhale 1-2 puffs into the lungs every 6 (six) hours as needed for shortness of breath.     [provider]  benzocaine (ORAJEL) 10 % mucosal gel Use as directed 1 application in the mouth or throat as needed for mouth pain. 10/22/17   Dartha Lodge, PA-C  clindamycin (CLEOCIN) 150 MG capsule Take 2 capsules (300 mg total) by mouth 3 (three) times daily for 10 days. 11/26/18 12/06/18  Nira Conn, MD  fluticasone-salmeterol (ADVAIR HFA) [phone] MCG/ACT inhaler Inhale 2 puffs into the lungs daily as needed (SOB).     [provider]  ibuprofen  (ADVIL,MOTRIN) 600 MG tablet Take 1 tablet (600 mg total) by mouth every 6 (six) hours as needed. 10/22/17   Dartha Lodge, PA-C  ibuprofen (ADVIL,MOTRIN) 800 MG tablet Take 1 tablet (800 mg total) by mouth 3 (three) times daily with meals. 09/08/17   Caccavale, Sophia, PA-C  oxyCODONE-acetaminophen (PERCOCET/ROXICET) 5-325 MG tablet Take 2 tablets by mouth every 4 (four) hours as needed (pain scale > 7). 03/13/17   Philip Aspen, DO  phenol (CHLORASEPTIC) 1.4 % LIQD Use as directed 1 spray in the mouth or throat as needed for throat irritation / pain. 09/08/17   Caccavale, Sophia, PA-C  Prenat w/o A Vit-FeFum-FePo-FA (CONCEPT OB) 130-92.4-1 MG CAPS Take 1 tablet by mouth daily. 08/08/16   Katrinka Blazing, [place], CNM  ranitidine (ZANTAC) 150 MG tablet Take 150 mg by mouth 2 (two) times daily.    [provider]  Past Surgical History Past Surgical History:  Procedure Laterality Date  . CESAREAN SECTION N/A 03/10/2017   Procedure: CESAREAN SECTION;  Surgeon: Essie Hart, MD;  Location: [facility] BIRTHING SUITES;  Service: Obstetrics;  Laterality: N/A;  . KNEE SURGERY     left   Family History Family History  Problem Relation Age of Onset  . Heart disease Father     Social History Social History   Tobacco Use  . Smoking status: Current Some Day Smoker    Packs/day: 0.25    Years: 2.00    Pack years: 0.50    Types: Cigars  . Smokeless tobacco: Never Used  Substance Use Topics  . Alcohol use: Yes  . 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  Constitutional:      General: She is not in acute distress.    Appearance: She is well-developed. She is not diaphoretic.  HENT:     Head: Normocephalic and atraumatic.     Right Ear: External ear normal.     Left Ear: External ear normal.     Nose: Nose normal.     Mouth/Throat:     Lips: No lesions.     Mouth: No oral lesions.     Tongue: No lesions.     Palate: No lesions.     Pharynx: Pharyngeal swelling and posterior oropharyngeal erythema present.     Tonsils: Tonsillar exudate and tonsillar abscess present. Swelling: 2+ on the right. 1+ on the left.  Eyes:     General: No scleral icterus.    Conjunctiva/sclera: Conjunctivae normal.  Neck:     Musculoskeletal: Normal range of motion.     Trachea: Phonation normal.  Cardiovascular:     Rate and Rhythm: Normal rate and regular rhythm.  Pulmonary:     Effort: Pulmonary effort is normal. No respiratory distress.     Breath sounds: No stridor.  Abdominal:     General: There is no distension.  Musculoskeletal: Normal range of motion.  Neurological:     Mental Status: She is alert and oriented to person, place, and time.  Psychiatric:        Behavior: Behavior normal.     ED Results and Treatments Labs (all labs ordered are listed, but only abnormal results are displayed) Labs Reviewed  GROUP A STREP BY PCR - Abnormal; Notable for the following components:      Result Value   Group A Strep by PCR DETECTED (*)    All other components within normal limits  EKG  EKG Interpretation  Date/Time:    Ventricular Rate:    PR Interval:    QRS Duration:   QT Interval:    QTC Calculation:   R Axis:     Text Interpretation:        Radiology No results found. Pertinent  labs & imaging results that were available during my care of the patient were reviewed by me and considered in my medical decision making (see chart for details).  Medications Ordered in ED Medications  clindamycin (CLEOCIN) capsule 300 mg (has no administration in time range)  dexamethasone (DECADRON) tablet 10 mg (has no administration in time range)  ibuprofen (ADVIL,MOTRIN) tablet 800 mg (has no administration in time range)                                                                                                                                    Procedures Procedures  (including critical care time)  Medical Decision Making / ED Course I have reviewed the nursing notes for this encounter and the patient's prior records (if available in EHR or on provided paperwork).    Rapid strep positive.  Will treat with clindamycin given her allergies.  Final Clinical Impression(s) / ED Diagnoses Final diagnoses:  Strep pharyngitis    Disposition: Discharge  Condition: Good  I have discussed the results, Dx and Tx plan with the patient who expressed understanding and agree(s) with the plan. Discharge instructions discussed at great length. The patient was given strict return precautions who verbalized understanding of the instructions. No further questions at time of discharge.    ED Discharge Orders         Ordered    clindamycin (CLEOCIN) 150 MG capsule  3 times daily     11/26/18 0002           Follow Up: Primary care provider  Schedule an appointment as soon as possible for a visit  in 5-7 days, If symptoms do not improve or  worsen     This chart was dictated using voice recognition software.  Despite best efforts to proofread,  errors can occur which can change the documentation meaning.   Nira Conn, MD 11/26/18 0002

## 2018-11-26 MED ORDER — IBUPROFEN 800 MG PO TABS
800.0000 mg | ORAL_TABLET | Freq: Once | ORAL | Status: AC
  Administered 2018-11-26: 800 mg via ORAL
  Filled 2018-11-26: qty 1

## 2018-11-26 MED ORDER — CLINDAMYCIN HCL 150 MG PO CAPS: 300.0000 mg | ORAL_CAPSULE | Freq: Three times a day (TID) | ORAL | 0 refills | Status: AC

## 2019-02-14 IMAGING — US US OB COMP LESS 14 WK
1 series · 14 of 28 positions shown · non-contrast
Comparison: None.

CLINICAL DATA: 27-year-old pregnant female with acute pelvic pain
for 3 days. Estimated gestational age of 10 weeks 4 days by LMP.

EXAM:
OBSTETRIC <14 WK ULTRASOUND
TECHNIQUE: Transabdominal ultrasound was performed for evaluation of the
gestation as well as the maternal uterus and adnexal regions.

[Series 1: us ob comp less 14 wk · 0.22mm/px · 42 acquisitions, 14 frames shown]
[im 2/42]
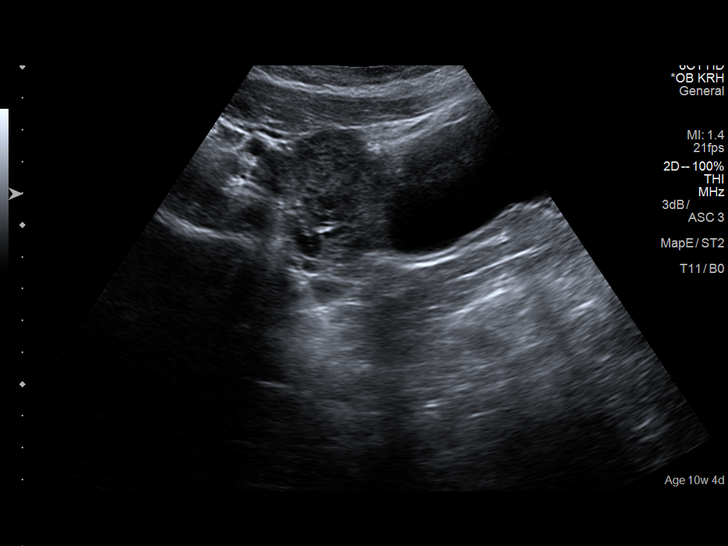
[im 5/42]
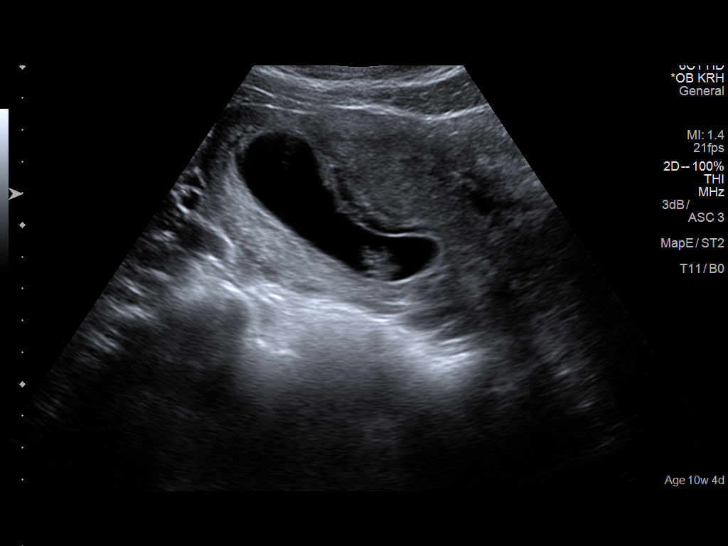
[im 8/42]
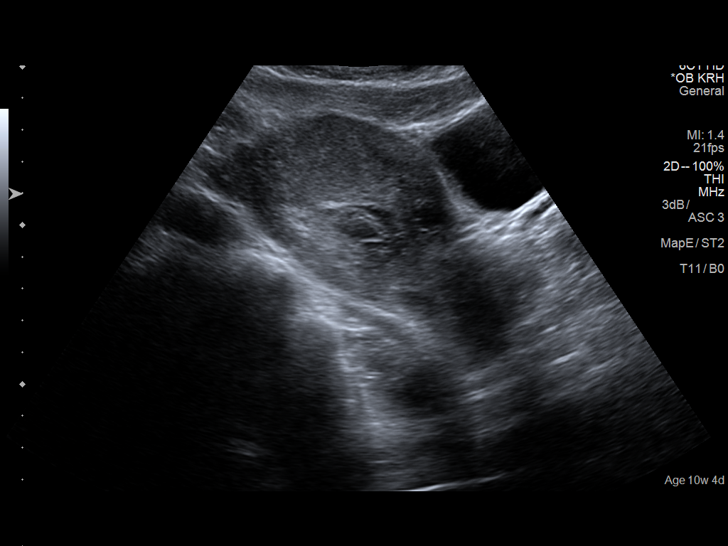
[im 11/42]
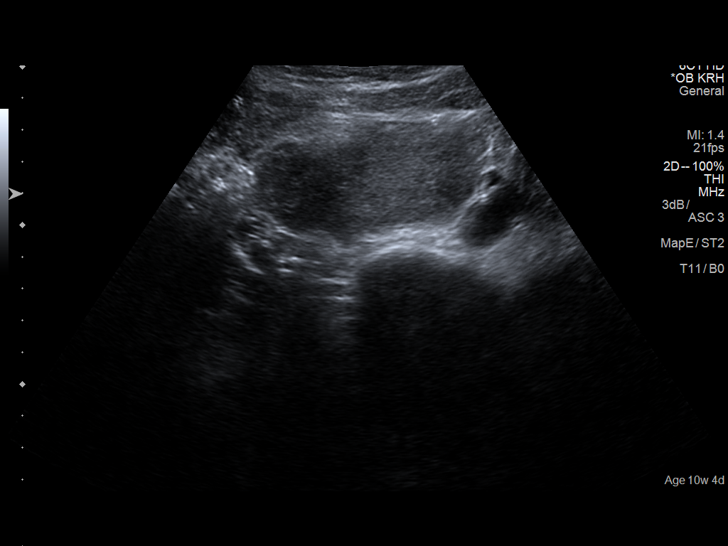
[im 14/42]
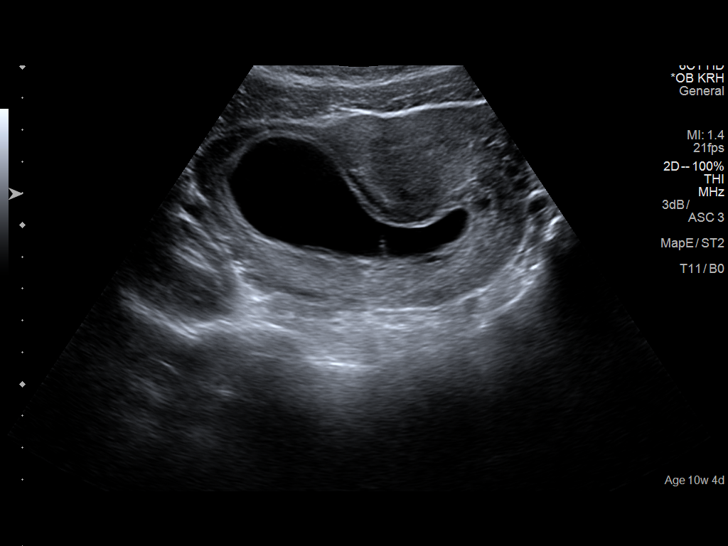
[im 17/42]
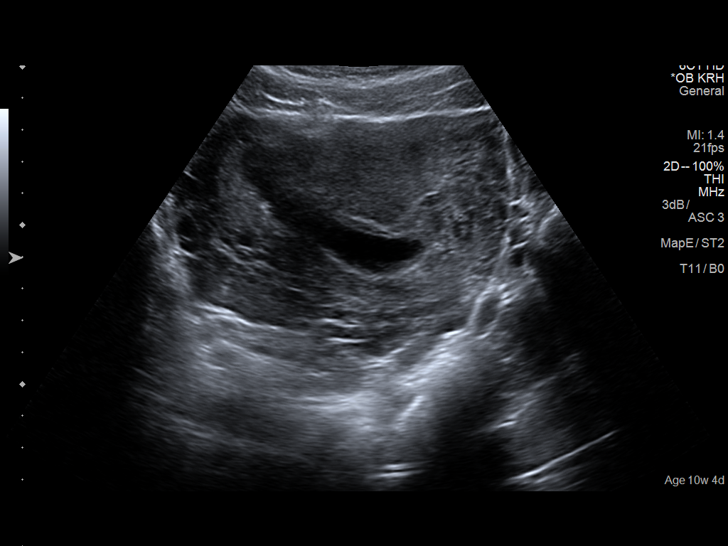
[im 20/42]
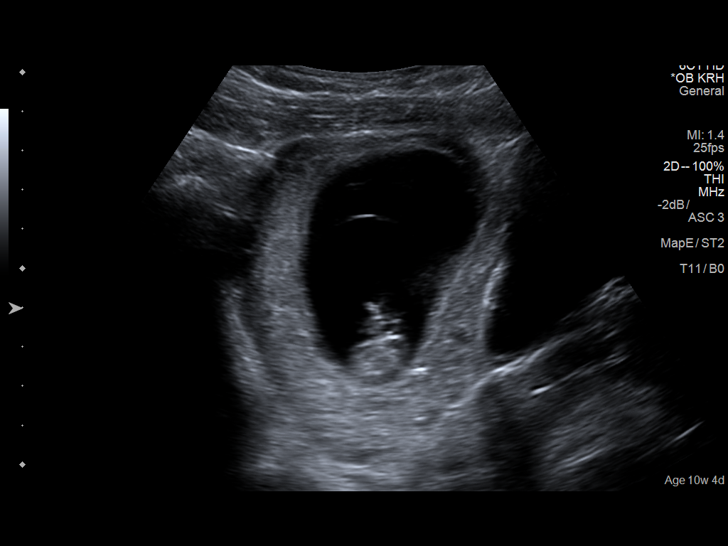
[im 23/42]
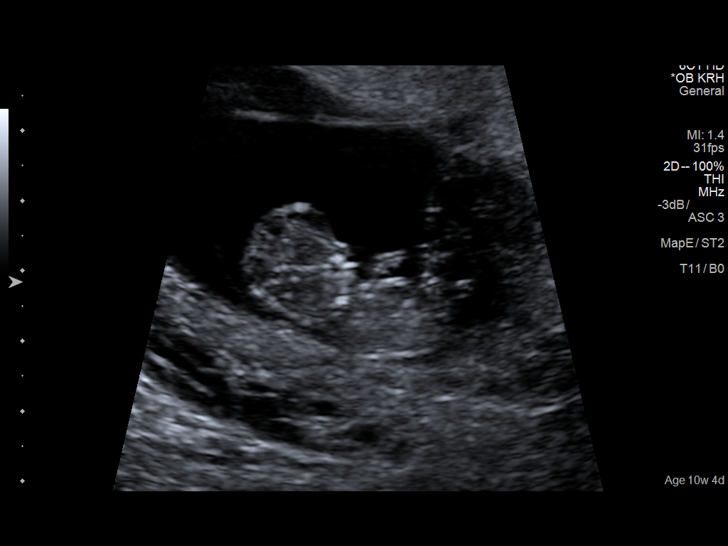
[im 26/42]
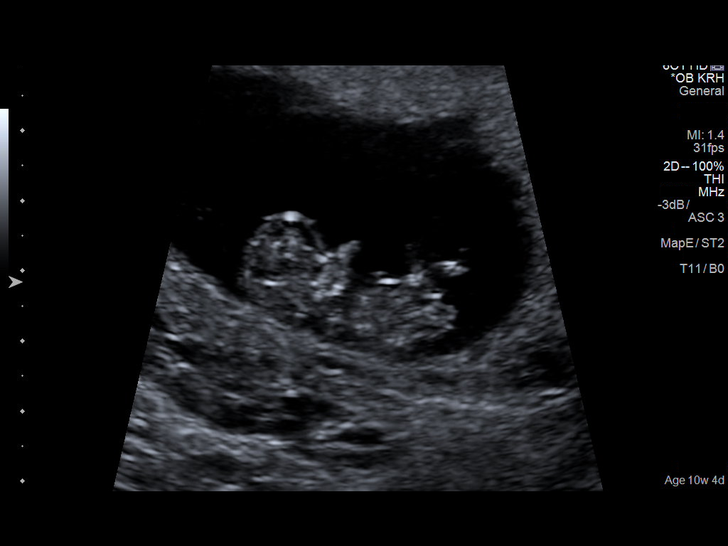
[im 29/42]
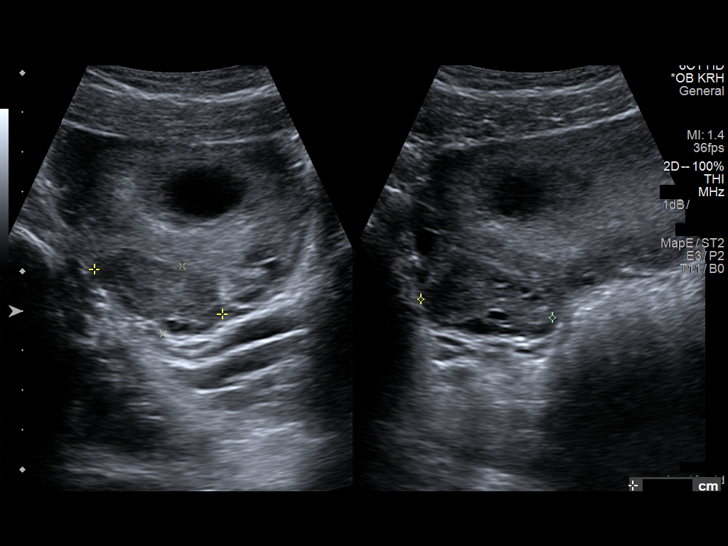
[im 32/42]
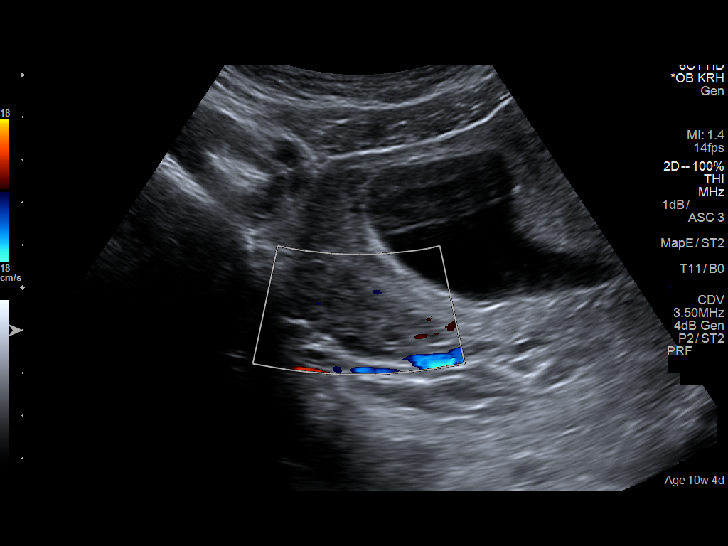
[im 35/42]
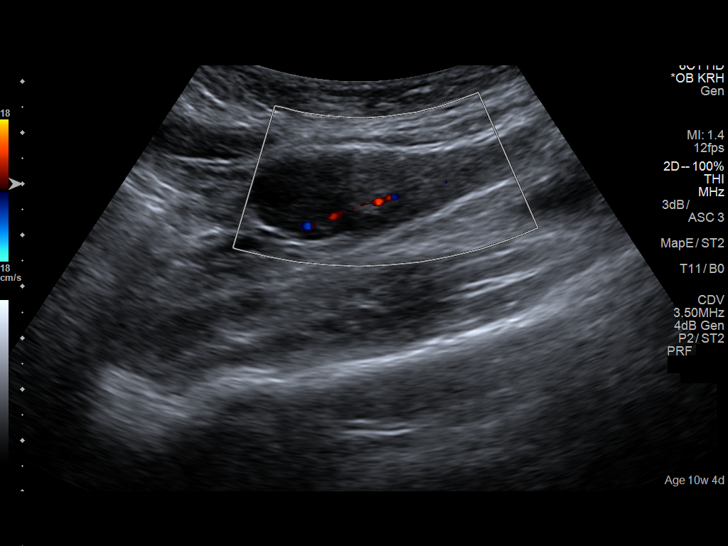
[im 38/42]
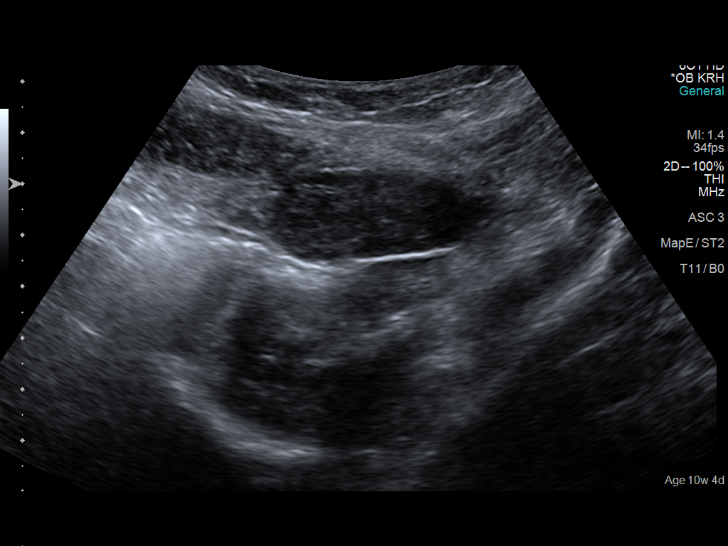
[im 42/42]
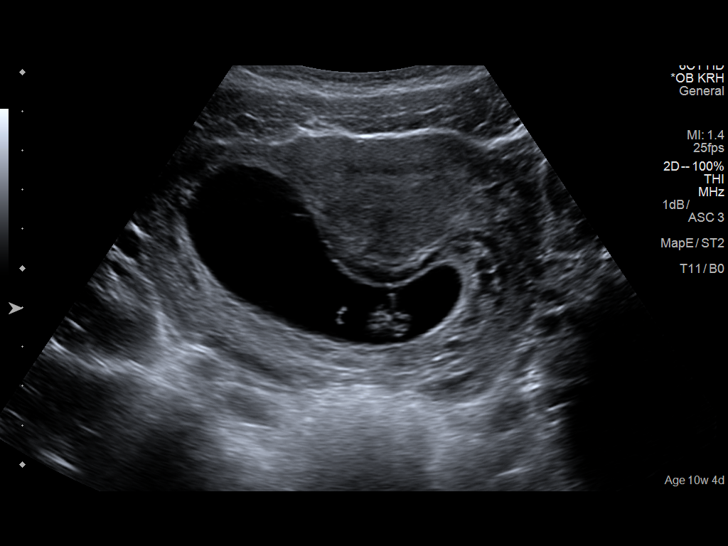

[14 of 28 positions shown; findings below may reference images not displayed]

FINDINGS: Intrauterine gestational sac: Single

Yolk sac:  Visualized.

Embryo:  Visualized.

Cardiac Activity: Visualized.

Heart Rate: 168 bpm

CRL:   31.9 mm   10 w 0 d                  US EDC: 03/12/2019

Subchorionic hemorrhage:  A small subchorionic hemorrhage is noted.

Maternal uterus/adnexae: The ovaries are unremarkable.

No free fluid or adnexal mass.
IMPRESSION: 1. Single living intrauterine gestation with estimated gestational
age of 10 weeks 0 days by this ultrasound.
2. Small subchorionic hemorrhage

## 2019-03-05 ENCOUNTER — Emergency Department (HOSPITAL_BASED_OUTPATIENT_CLINIC_OR_DEPARTMENT_OTHER)
Admission: EM | Admit: 2019-03-05 | Discharge: 2019-03-05 | Disposition: A | Discharge status: Home / Self Care | Payer: Self-pay | Attending: Emergency Medicine | Admitting: Emergency Medicine

## 2019-03-05 ENCOUNTER — Other Ambulatory Visit: Payer: Self-pay

## 2019-03-05 ENCOUNTER — Encounter (HOSPITAL_BASED_OUTPATIENT_CLINIC_OR_DEPARTMENT_OTHER): Payer: Self-pay | Admitting: Emergency Medicine

## 2019-03-05 DIAGNOSIS — J45909 Unspecified asthma, uncomplicated: Secondary | ICD-10-CM | POA: Insufficient documentation

## 2019-03-05 DIAGNOSIS — Z79899 Other long term (current) drug therapy: Secondary | ICD-10-CM | POA: Insufficient documentation

## 2019-03-05 DIAGNOSIS — R21 Rash and other nonspecific skin eruption: Secondary | ICD-10-CM | POA: Insufficient documentation

## 2019-03-05 DIAGNOSIS — F17210 Nicotine dependence, cigarettes, uncomplicated: Secondary | ICD-10-CM | POA: Insufficient documentation

## 2019-03-05 MED ORDER — CLOTRIMAZOLE 1 % EX CREA: TOPICAL_CREAM | CUTANEOUS | 0 refills | Status: AC

## 2019-03-05 NOTE — ED Provider Notes (Signed)
[facility] EMERGENCY DEPARTMENT Provider Note   CSN: [id number] Arrival date & time: 03/05/19  0801    History   Chief Complaint Chief Complaint  Patient presents with  . Rash    HPI Lisa DrownKeyona M Bond is a 28 y.o. female.     HPI   28 year old female who presents with concern for rash to the left arm.  Reports that is been present for approximately 1 to 2 weeks.  It is itchy.  Reports that she is paranoid that it was ringworm.  Thinks she is maybe had exposure to new soaps and lotion, but denies any other new changes.  She brought her son here to with a rash that appears different, and developed yesterday.  She denies any other symptoms including no fevers, cough, congestion.  She has history of some rash in the past but no diagnosis of eczema.  Rash is itchy.   Past Medical History:  Diagnosis Date  . Asthma    last inhaler 3 days ago  . Chlamydia   . GERD (gastroesophageal reflux disease)   . Gonorrhea   . Infection 2012   GC, chlamydia    Patient Active Problem List   Diagnosis Date Noted  . Status post repeat low transverse cesarean section 03/10/2017  . Preeclampsia, third trimester 03/08/2017    Past Surgical History:  Procedure Laterality Date  . CESAREAN SECTION N/A 03/10/2017   Procedure: CESAREAN SECTION;  Surgeon: Essie HartPinn, Walda, MD;  [facility];  Service: Obstetrics;  Laterality: N/A;  . KNEE SURGERY     left     OB History    Gravida  2   Para  1   Term  1   Preterm      AB      Living  1     SAB      TAB      Ectopic      Multiple  0   Live Births  1            Home Medications    Prior to Admission medications   Medication Sig Start Date End Date Taking? Authorizing Provider  acetaminophen (TYLENOL) 500 MG tablet Take 2 tablets (1,000 mg total) by mouth every 6 (six) hours as needed for mild pain or moderate pain. Patient not taking: Reported on 03/08/2017 08/08/16   Katrinka BlazingSmith, [place], CNM  albuterol  (PROVENTIL HFA) [phone](90 Base) MCG/ACT inhaler Inhale 1-2 puffs into the lungs every 6 (six) hours as needed for shortness of breath.     [provider]  benzocaine (ORAJEL) 10 % mucosal gel Use as directed 1 application in the mouth or throat as needed for mouth pain. 10/22/17   Dartha LodgeFord, Kelsey N, PA-C  clotrimazole (LOTRIMIN) 1 % cream Apply to affected area 2 times daily until resolution. 03/05/19   Alvira MondaySchlossman, Davina Howlett, MD  fluticasone-salmeterol (ADVAIR HFA) 230-21 MCG/ACT inhaler Inhale 2 puffs into the lungs daily as needed (SOB).     [provider]  ibuprofen (ADVIL,MOTRIN) 600 MG tablet Take 1 tablet (600 mg total) by mouth every 6 (six) hours as needed. 10/22/17   Dartha LodgeFord, Kelsey N, PA-C  ibuprofen (ADVIL,MOTRIN) 800 MG tablet Take 1 tablet (800 mg total) by mouth 3 (three) times daily with meals. 09/08/17   Caccavale, Sophia, PA-C  oxyCODONE-acetaminophen (PERCOCET/ROXICET) 5-325 MG tablet Take 2 tablets by mouth every 4 (four) hours as needed (pain scale > 7). 03/13/17   Philip Aspenallahan, Sidney, DO  phenol (  CHLORASEPTIC) 1.4 % LIQD Use as directed 1 spray in the mouth or throat as needed for throat irritation / pain. 09/08/17   Caccavale, Sophia, PA-C  Prenat w/o A Vit-FeFum-FePo-FA (CONCEPT OB) 130-92.4-1 MG CAPS Take 1 tablet by mouth daily. 08/08/16   Tamala Julian, [place], CNM  ranitidine (ZANTAC) 150 MG tablet Take 150 mg by mouth 2 (two) times daily.    [provider]    Family History Family History  Problem Relation Age of Onset  . Heart disease Father     Social History Social History   Tobacco Use  . Smoking status: Current Some Day Smoker    Packs/day: 0.25    Years: 2.00    Pack years: 0.50    Types: Cigars  . Smokeless tobacco: Never Used  Substance Use Topics  . Alcohol use: Yes  . 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  Respiratory: Negative for cough.   Skin: Positive for rash.     Physical Exam Updated Vital Signs BP 117/69   Pulse 75   Temp 98.2 F (36.8 C)   Resp 20   Ht 5\' 2"  (1.575 m)   Wt 59.4 kg   LMP 02/20/2019   SpO2 100%   BMI 23.94 kg/m   Physical Exam Vitals signs and nursing note reviewed.  Constitutional:      General: She is not in acute distress.    Appearance: She is well-developed. She is not diaphoretic.  HENT:     Head: Normocephalic and atraumatic.  Neck:     Musculoskeletal: Normal range of motion.  Cardiovascular:     Rate and Rhythm: Normal rate.  Pulmonary:     Effort: Pulmonary effort is normal. No respiratory distress.  Musculoskeletal:        General: No swelling.  Skin:    General: Skin is warm and dry.     Findings: Rash (2 circular plaques, appearlike tiny papules in grouped circle versus plaque) present. No erythema.  Neurological:     Mental Status: She is alert and oriented to person, place, and time.      ED Treatments / Results  Labs (all labs ordered are listed, but only abnormal results are displayed) Labs Reviewed - No data to display  EKG None  Radiology No results found.  Procedures Procedures (including critical care time)  Medications Ordered in ED Medications - No data to display   Initial Impression / Assessment and Plan / ED Course  I have reviewed the triage vital signs and the nursing notes.  Pertinent labs & imaging results that were available during my care of the patient were reviewed by me and considered in my medical decision making (see chart for details).        28 year old female who presents with concern for rash to the left arm.  Rash does not have the appearance of SSS, TEN, erythroderma, scabies, RMSF or hives.   Possible eczema, contact dermatitis, or tinea corporis.  Discussed that well and does not have classical appearance of tinea as it is not annular, it is  difficult for me to completely exclude the possibility of this given circular appearance enteritis.  We will treat with Lotrimin, recommend follow-up with PCP, monitoring for signs of possible allergic triggers.  Final Clinical Impressions(s) / ED  Diagnoses   Final diagnoses:  Rash    ED Discharge Orders         Ordered    clotrimazole (LOTRIMIN) 1 % cream     03/05/19 0844           Alvira MondaySchlossman, Aleksia Freiman, MD 03/05/19 1013

## 2019-03-05 NOTE — ED Triage Notes (Signed)
Rash to both arms for two weeks.  Pt states it itches.

## 2021-01-13 ENCOUNTER — Emergency Department (HOSPITAL_BASED_OUTPATIENT_CLINIC_OR_DEPARTMENT_OTHER)
Admission: EM | Admit: 2021-01-13 | Discharge: 2021-01-13 | Disposition: A | Discharge status: Home / Self Care | Payer: Self-pay | Attending: Emergency Medicine | Admitting: Emergency Medicine

## 2021-01-13 ENCOUNTER — Encounter (HOSPITAL_BASED_OUTPATIENT_CLINIC_OR_DEPARTMENT_OTHER): Payer: Self-pay

## 2021-01-13 ENCOUNTER — Other Ambulatory Visit: Payer: Self-pay

## 2021-01-13 DIAGNOSIS — J45909 Unspecified asthma, uncomplicated: Secondary | ICD-10-CM | POA: Insufficient documentation

## 2021-01-13 DIAGNOSIS — R0989 Other specified symptoms and signs involving the circulatory and respiratory systems: Secondary | ICD-10-CM | POA: Insufficient documentation

## 2021-01-13 DIAGNOSIS — R059 Cough, unspecified: Secondary | ICD-10-CM | POA: Insufficient documentation

## 2021-01-13 DIAGNOSIS — R112 Nausea with vomiting, unspecified: Secondary | ICD-10-CM | POA: Insufficient documentation

## 2021-01-13 DIAGNOSIS — F17290 Nicotine dependence, other tobacco product, uncomplicated: Secondary | ICD-10-CM | POA: Insufficient documentation

## 2021-01-13 DIAGNOSIS — R109 Unspecified abdominal pain: Secondary | ICD-10-CM | POA: Insufficient documentation

## 2021-01-13 DIAGNOSIS — R197 Diarrhea, unspecified: Secondary | ICD-10-CM | POA: Insufficient documentation

## 2021-01-13 LAB — URINALYSIS, ROUTINE W REFLEX MICROSCOPIC
Bilirubin Urine: NEGATIVE
Glucose, UA: NEGATIVE mg/dL
Hgb urine dipstick: NEGATIVE
Ketones, ur: 40 mg/dL — AB
Nitrite: NEGATIVE
Protein, ur: NEGATIVE mg/dL
Specific Gravity, Urine: >1.03 — ABNORMAL HIGH (ref 1.005–1.030)
pH: 5 (ref 5.0–8.0)

## 2021-01-13 LAB — COMPREHENSIVE METABOLIC PANEL
ALT: 21 U/L (ref 0–44)
AST: 20 U/L (ref 15–41)
Albumin: 4.4 g/dL (ref 3.5–5.0)
Alkaline Phosphatase: 66 U/L (ref 38–126)
Anion gap: 10 (ref 5–15)
BUN: 17 mg/dL (ref 6–20)
CO2: 20 mmol/L — ABNORMAL LOW (ref 22–32)
Calcium: 9 mg/dL (ref 8.9–10.3)
Chloride: 104 mmol/L (ref 98–111)
Creatinine, Ser: 0.71 mg/dL (ref 0.44–1.00)
GFR, Estimated: >60 mL/min (ref >60)
Glucose, Bld: 108 mg/dL — ABNORMAL HIGH (ref 70–99)
Potassium: 4.1 mmol/L (ref 3.5–5.1)
Sodium: 134 mmol/L — ABNORMAL LOW (ref 135–145)
Total Bilirubin: 0.6 mg/dL (ref 0.3–1.2)
Total Protein: 8.3 g/dL — ABNORMAL HIGH (ref 6.5–8.1)

## 2021-01-13 LAB — PREGNANCY, URINE: Preg Test, Ur: NEGATIVE

## 2021-01-13 LAB — CBC WITH DIFFERENTIAL/PLATELET
Abs Immature Granulocytes: 0.02 10*3/uL (ref 0.00–0.07)
Basophils Absolute: 0 10*3/uL (ref 0.0–0.1)
Basophils Relative: 0 %
Eosinophils Absolute: 0 10*3/uL (ref 0.0–0.5)
Eosinophils Relative: 0 %
HCT: 50 % — ABNORMAL HIGH (ref 36.0–46.0)
Hemoglobin: 16.7 g/dL — ABNORMAL HIGH (ref 12.0–15.0)
Immature Granulocytes: 0 %
Lymphocytes Relative: 5 %
Lymphs Abs: 0.6 10*3/uL — ABNORMAL LOW (ref 0.7–4.0)
MCH: 30.4 pg (ref 26.0–34.0)
MCHC: 33.4 g/dL (ref 30.0–36.0)
MCV: 91.1 fL (ref 80.0–100.0)
Monocytes Absolute: 0.4 10*3/uL (ref 0.1–1.0)
Monocytes Relative: 4 %
Neutro Abs: 9.4 10*3/uL — ABNORMAL HIGH (ref 1.7–7.7)
Neutrophils Relative %: 91 %
Platelets: 244 10*3/uL (ref 150–400)
RBC: 5.49 MIL/uL — ABNORMAL HIGH (ref 3.87–5.11)
RDW: 13.2 % (ref 11.5–15.5)
WBC: 10.5 10*3/uL (ref 4.0–10.5)
nRBC: 0 % (ref 0.0–0.2)

## 2021-01-13 LAB — URINALYSIS, MICROSCOPIC (REFLEX)

## 2021-01-13 LAB — LIPASE, BLOOD: Lipase: 25 U/L (ref 11–51)

## 2021-01-13 MED ORDER — ONDANSETRON HCL 4 MG/2ML IJ SOLN
4.0000 mg | Freq: Once | INTRAMUSCULAR | Status: AC
  Administered 2021-01-13: 4 mg via INTRAVENOUS
  Filled 2021-01-13: qty 2

## 2021-01-13 MED ORDER — LACTATED RINGERS IV BOLUS
1000.0000 mL | Freq: Once | INTRAVENOUS | Status: AC
  Administered 2021-01-13: 1000 mL via INTRAVENOUS

## 2021-01-13 MED ORDER — ONDANSETRON 4 MG PO TBDP: 4.0000 mg | ORAL_TABLET | Freq: Three times a day (TID) | ORAL | 0 refills | Status: DC | PRN

## 2021-01-13 NOTE — ED Triage Notes (Signed)
Pt arrives with c/o NVD starting this morning, states that she took a pregnancy test which was negative. States "I think it may be a virus". Pt reports her son had a stomach bug last week.

## 2021-01-13 NOTE — ED Provider Notes (Signed)
[facility] EMERGENCY DEPARTMENT Provider Note   CSN: [id number] Arrival date & time: 01/13/21  1209     History Chief Complaint  Patient presents with  . Emesis    Lisa Bond is a 30 y.o. female.  30 year old female with past medical history below who presents with vomiting and diarrhea.  Patient's son, who attends daycare, recently had a stomach illness causing vomiting but symptoms only lasted a day and he is currently well.  This morning, patient began having nausea and vomiting associated with nonbloody diarrhea.  She has had some associated abdominal cramping but no severe abdominal pain.  She has had several days of mild viral symptoms including cough, congestion, and sore throat but suspected it was just a mild cold so she did not seek evaluation until the GI symptoms started today.  She tried to take gas medicine but threw it back up.  No recent travel or recent antibiotic use.  The history is provided by the patient.  Emesis      Past Medical History:  Diagnosis Date  . Asthma    last inhaler 3 days ago  . Chlamydia   . GERD (gastroesophageal reflux disease)   . Gonorrhea   . Infection 2012   GC, chlamydia    Patient Active Problem List   Diagnosis Date Noted  . Status post repeat low transverse cesarean section 03/10/2017  . Preeclampsia, third trimester 03/08/2017    Past Surgical History:  Procedure Laterality Date  . CESAREAN SECTION N/A 03/10/2017   Procedure: CESAREAN SECTION;  Surgeon: Essie Hart, MD;  Location: [facility];  Service: Obstetrics;  Laterality: N/A;  . KNEE SURGERY     left     OB History    Gravida  2   Para  1   Term  1   Preterm      AB      Living  1     SAB      IAB      Ectopic      Multiple  0   Live Births  1           Family History  Problem Relation Age of Onset  . Heart disease Father     Social History   Tobacco Use  . Smoking status: Current Some Day Smoker     Packs/day: 0.25    Years: 2.00    Pack years: 0.50    Types: Cigars  . Smokeless tobacco: Never Used  Vaping Use  . Vaping Use: Never used  Substance Use Topics  . Alcohol use: Yes  . 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 Authorizing Provider  acetaminophen (TYLENOL) 500 MG tablet Take 2 tablets (1,000 mg total) by mouth every 6 (six) hours as needed for mild pain or moderate pain. Patient not taking: Reported on 03/08/2017 08/08/16   Katrinka Blazing [place], CNM  albuterol (PROVENTIL HFA) 108 [phone] Base) MCG/ACT inhaler Inhale 1-2 puffs into the lungs every 6 (six) hours as needed for shortness of breath.     [provider]  benzocaine (ORAJEL) 10 % mucosal gel Use as directed 1 application in the mouth or throat as needed for mouth pain. 10/22/17   Dartha Lodge, PA-C  clotrimazole (LOTRIMIN) 1 % cream Apply to affected area 2 times  daily until resolution. 03/05/19   Alvira Monday, MD  fluticasone-salmeterol (ADVAIR HFA) 230-21 MCG/ACT inhaler Inhale 2 puffs into the lungs daily as needed (SOB).     [provider]  ibuprofen (ADVIL,MOTRIN) 600 MG tablet Take 1 tablet (600 mg total) by mouth every 6 (six) hours as needed. 10/22/17   Dartha Lodge, PA-C  ibuprofen (ADVIL,MOTRIN) 800 MG tablet Take 1 tablet (800 mg total) by mouth 3 (three) times daily with meals. 09/08/17   Caccavale, Sophia, PA-C  oxyCODONE-acetaminophen (PERCOCET/ROXICET) 5-325 MG tablet Take 2 tablets by mouth every 4 (four) hours as needed (pain scale > 7). 03/13/17   Philip Aspen, DO  phenol (CHLORASEPTIC) 1.4 % LIQD Use as directed 1 spray in the mouth or throat as needed for throat irritation / pain. 09/08/17   Caccavale, Sophia, PA-C  Prenat w/o A Vit-FeFum-FePo-FA (CONCEPT OB) 130-92.4-1 MG CAPS Take 1 tablet by mouth daily. 08/08/16   Katrinka Blazing, [place], CNM   ranitidine (ZANTAC) 150 MG tablet Take 150 mg by mouth 2 (two) times daily.    [provider]    Allergies    Amoxicillin and Penicillins  Review of Systems   Review of Systems  Gastrointestinal: Positive for vomiting.   All other systems reviewed and are negative except that which was mentioned in HPI  Physical Exam Updated Vital Signs BP 110/66 (BP Location: Right Arm)   Pulse 89   Temp 97.9 F (36.6 C) (Oral)   Resp 18   Ht 5\' 2"  (1.575 m)   Wt 62.6 kg   LMP 12/30/2020   SpO2 98%   BMI 25.24 kg/m   Physical Exam Constitutional:      General: She is not in acute distress.    Appearance: Normal appearance.  HENT:     Head: Normocephalic and atraumatic.  Eyes:     Conjunctiva/sclera: Conjunctivae normal.  Cardiovascular:     Rate and Rhythm: Normal rate and regular rhythm.     Heart sounds: Normal heart sounds. No murmur heard.   Pulmonary:     Effort: Pulmonary effort is normal.     Breath sounds: Normal breath sounds.  Abdominal:     General: Abdomen is flat. There is no distension.     Palpations: Abdomen is soft.     Tenderness: There is no abdominal tenderness.     Comments: Hyperactive bowel sounds  Musculoskeletal:     Right lower leg: No edema.     Left lower leg: No edema.  Skin:    General: Skin is warm and dry.  Neurological:     Mental Status: She is alert and oriented to person, place, and time.     Comments: fluent  Psychiatric:        Mood and Affect: Mood normal.        Behavior: Behavior normal.     ED Results / Procedures / Treatments   Labs (all labs ordered are listed, but only abnormal results are displayed) Labs Reviewed  PREGNANCY, URINE  URINALYSIS, ROUTINE W REFLEX MICROSCOPIC  COMPREHENSIVE METABOLIC PANEL  LIPASE, BLOOD  CBC WITH DIFFERENTIAL/PLATELET    EKG None  Radiology No results found.  Procedures Procedures   Medications Ordered in ED Medications  ondansetron (ZOFRAN) injection 4 mg (has  no administration in time range)  lactated ringers bolus 1,000 mL (has no administration in time range)    ED Course  I have reviewed the triage vital signs and the nursing notes.  Pertinent labs that were  available during my care of the patient were reviewed by me and considered in my medical decision making (see chart for details).    MDM Rules/Calculators/A&P                          Nontoxic on exam, normal vital signs, no focal abdominal tenderness.  Lab work with ketones in the urine but otherwise reassuring.  WBC normal.  After receiving fluids and Zofran, patient able to tolerate Sprite with no problems.  She states she feels much better on reassessment.  I discussed supportive measures for symptoms including Zofran as needed, continued hydration at home, and slow advancement of diet.  Reviewed return precautions and she voiced understanding. Final Clinical Impression(s) / ED Diagnoses Final diagnoses:  None    Rx / DC Orders ED Discharge Orders    None       Binyomin Brann, Ambrose [place], MD 01/13/21 1443

## 2021-11-23 ENCOUNTER — Emergency Department (HOSPITAL_BASED_OUTPATIENT_CLINIC_OR_DEPARTMENT_OTHER)
Admission: EM | Admit: 2021-11-23 | Discharge: 2021-11-23 | Disposition: A | Discharge status: Home / Self Care | Payer: Self-pay | Attending: Emergency Medicine | Admitting: Emergency Medicine

## 2021-11-23 ENCOUNTER — Encounter (HOSPITAL_BASED_OUTPATIENT_CLINIC_OR_DEPARTMENT_OTHER): Payer: Self-pay

## 2021-11-23 ENCOUNTER — Other Ambulatory Visit: Payer: Self-pay

## 2021-11-23 DIAGNOSIS — L0501 Pilonidal cyst with abscess: Secondary | ICD-10-CM | POA: Insufficient documentation

## 2021-11-23 DIAGNOSIS — L0231 Cutaneous abscess of buttock: Secondary | ICD-10-CM

## 2021-11-23 HISTORY — DX: Anal fissure, unspecified: K60.2

## 2021-11-23 MED ORDER — LIDOCAINE-EPINEPHRINE (PF) 2 %-1:200000 IJ SOLN
20.0000 mL | Freq: Once | INTRAMUSCULAR | Status: AC
  Administered 2021-11-23: 20 mL
  Filled 2021-11-23: qty 20

## 2021-11-23 MED ORDER — LIDOCAINE-EPINEPHRINE-TETRACAINE (LET) TOPICAL GEL
3.0000 mL | Freq: Once | TOPICAL | Status: AC
  Administered 2021-11-23: 3 mL via TOPICAL
  Filled 2021-11-23: qty 3

## 2021-11-23 NOTE — ED Provider Notes (Signed)
[facility] EMERGENCY DEPARTMENT Provider Note   CSN: [id number] Arrival date & time: 11/23/21  4481     History  Chief Complaint  Patient presents with   Abscess    Lisa Bond is a 31 y.o. female.  HPI     31 year old female comes in with chief complaint of abscess She indicates that on Friday she started having some discomfort over her gluteal top.  Over time, she has had increased pain.  She looked at the area, noticed that it was not a hemorrhoidal discomfort and decided to come to the ER.  She has history of boils but denies any history of pilonidal cyst.  No trauma in the area.  Pain is located primarily between her gluteal folds superiorly.  No diabetes.  Home Medications Prior to Admission medications   Medication Sig Start Date End Date Taking? Authorizing Provider  acetaminophen (TYLENOL) 500 MG tablet Take 2 tablets (1,000 mg total) by mouth every 6 (six) hours as needed for mild pain or moderate pain. Patient not taking: Reported on 03/08/2017 08/08/16   Katrinka Blazing [place], CNM  albuterol (PROVENTIL HFA) [phone] Base) MCG/ACT inhaler Inhale 1-2 puffs into the lungs every 6 (six) hours as needed for shortness of breath.     [provider]  benzocaine (ORAJEL) 10 % mucosal gel Use as directed 1 application in the mouth or throat as needed for mouth pain. 10/22/17   Dartha Lodge, PA-C  clotrimazole (LOTRIMIN) 1 % cream Apply to affected area 2 times daily until resolution. 03/05/19   Alvira Monday, MD  fluticasone-salmeterol (ADVAIR HFA) 230-21 MCG/ACT inhaler Inhale 2 puffs into the lungs daily as needed (SOB).     [provider]  ibuprofen (ADVIL,MOTRIN) 600 MG tablet Take 1 tablet (600 mg total) by mouth every 6 (six) hours as needed. 10/22/17   Dartha Lodge, PA-C  ibuprofen (ADVIL,MOTRIN) 800 MG tablet Take 1 tablet (800 mg total) by mouth 3 (three) times daily with meals. 09/08/17   Caccavale, Sophia, PA-C  ondansetron (ZOFRAN ODT) 4  MG disintegrating tablet Take 1 tablet (4 mg total) by mouth every 8 (eight) hours as needed for nausea or vomiting. 01/13/21   Little, Ambrose [place], MD  oxyCODONE-acetaminophen (PERCOCET/ROXICET) 5-325 MG tablet Take 2 tablets by mouth every 4 (four) hours as needed (pain scale > 7). 03/13/17   Philip Aspen, DO  phenol (CHLORASEPTIC) 1.4 % LIQD Use as directed 1 spray in the mouth or throat as needed for throat irritation / pain. 09/08/17   Caccavale, Sophia, PA-C  Prenat w/o A Vit-FeFum-FePo-FA (CONCEPT OB) 130-92.4-1 MG CAPS Take 1 tablet by mouth daily. 08/08/16   Katrinka Blazing, [place], CNM  ranitidine (ZANTAC) 150 MG tablet Take 150 mg by mouth 2 (two) times daily.    [provider]      Allergies    Amoxicillin and Penicillins    Review of Systems   Review of Systems  All other systems reviewed and are negative.  Physical Exam Updated Vital Signs BP 122/81    Pulse 98    Temp 98.5 F (36.9 C)    Resp 16    LMP 11/01/2021    SpO2 98%  Physical Exam Vitals and nursing note reviewed.  Constitutional:      Appearance: She is well-developed.  HENT:     Head: Atraumatic.  Cardiovascular:     Rate and Rhythm: Normal rate.  Pulmonary:     Effort: Pulmonary effort is normal.  Musculoskeletal:  Cervical back: Normal range of motion and neck supple.  Skin:    General: Skin is warm and dry.     Comments: In between the gluteal fold superiorly patient has fluctuant and tender, erythematous lesion  Neurological:     Mental Status: She is alert and oriented to person, place, and time.    ED Results / Procedures / Treatments   Labs (all labs ordered are listed, but only abnormal results are displayed) Labs Reviewed - No data to display  EKG None  Radiology No results found.  Procedures .Marland KitchenIncision and Drainage  Date/Time: 11/23/2021 10:13 AM Performed by: Derwood Kaplan, MD Authorized by: Derwood Kaplan, MD   Consent:    Consent obtained:  Verbal   Consent  given by:  Patient   Risks, benefits, and alternatives were discussed: yes     Risks discussed:  Incomplete drainage, bleeding, infection and pain   Alternatives discussed:  Delayed treatment Universal protocol:    Procedure explained and questions answered to patient or proxy's satisfaction: yes     Patient identity confirmed:  Arm band Location:    Type:  Abscess   Location:  Anogenital   Anogenital location:  Gluteal cleft Pre-procedure details:    Skin preparation:  Chlorhexidine with alcohol and antiseptic wash Sedation:    Sedation type:  None Anesthesia:    Anesthesia method:  Topical application   Topical anesthetic:  LET Procedure type:    Complexity:  Complex Procedure details:    Ultrasound guidance: no     Needle aspiration: no     Incision types:  Stab incision   Incision depth:  Subcutaneous   Wound management:  Probed and deloculated, irrigated with saline and extensive cleaning   Drainage:  Purulent   Drainage amount:  Moderate   Wound treatment:  Drain placed   Packing materials:  1/4 in iodoform gauze Post-procedure details:    Procedure completion:  Tolerated well, no immediate complications    Medications Ordered in ED Medications  lidocaine-EPINEPHrine (XYLOCAINE W/EPI) 2 %-1:200000 (PF) injection 20 mL (has no administration in time range)  lidocaine-EPINEPHrine-tetracaine (LET) topical gel (3 mLs Topical Given 11/23/21 0843)    ED Course/ Medical Decision Making/ A&P                           Medical Decision Making Risk Prescription drug management.   31 year old comes in with chief complaint of gluteal pain.  She is noted to have fluctuant lesion over the gluteal cleft superiorly.  Differential diagnosis includes cyst, gluteal cleft abscess, pilonidal cyst, pilonidal abscess.  Patient consented to incision and drainage of the tender site. Moderate to large amount of purulent drainage appreciated and suctioned.  Patient is healthy, not  having any systemic symptoms therefore no labs are indicated.  There is no evidence of surrounding cellulitis, therefore we will not put her on antibiotics.  Advised return precautions in 3 days for wound recheck.  Final Clinical Impression(s) / ED Diagnoses Final diagnoses:  Pilonidal abscess  Abscess of gluteal cleft    Rx / DC Orders ED Discharge Orders     None         Derwood Kaplan, MD 11/23/21 1017

## 2021-11-23 NOTE — Discharge Instructions (Signed)
Read the instructions provided on pilonidal cyst abscess drainage that are printed.  Follow-up with your doctor in 3 days for recheck.  If they are unable to see you or treat this if it is getting worse, then come to the ER.

## 2021-11-23 NOTE — ED Triage Notes (Signed)
Pt reports abscess in between cheeks of buttocks since Friday . No drainage

## 2021-11-24 ENCOUNTER — Emergency Department (HOSPITAL_BASED_OUTPATIENT_CLINIC_OR_DEPARTMENT_OTHER)
Admission: EM | Admit: 2021-11-24 | Discharge: 2021-11-24 | Disposition: A | Discharge status: Home / Self Care | Payer: Self-pay | Attending: Emergency Medicine | Admitting: Emergency Medicine

## 2021-11-24 ENCOUNTER — Other Ambulatory Visit: Payer: Self-pay

## 2021-11-24 ENCOUNTER — Encounter (HOSPITAL_BASED_OUTPATIENT_CLINIC_OR_DEPARTMENT_OTHER): Payer: Self-pay

## 2021-11-24 DIAGNOSIS — Z4801 Encounter for change or removal of surgical wound dressing: Secondary | ICD-10-CM | POA: Insufficient documentation

## 2021-11-24 DIAGNOSIS — Z5189 Encounter for other specified aftercare: Secondary | ICD-10-CM

## 2021-11-24 NOTE — ED Triage Notes (Signed)
Packing came out of wound and wanting pain meds

## 2021-11-24 NOTE — Discharge Instructions (Signed)
Continue warm water soaks for pain.  Recommend 1000 mg of Tylenol every 6 hours as needed for pain.  Recommend 800 mg ibuprofen every 8 hours as needed for pain.

## 2021-11-24 NOTE — ED Provider Notes (Signed)
[facility] EMERGENCY DEPARTMENT Provider Note   CSN: [id number] Arrival date & time: 11/24/21  7341     History  No chief complaint on file.   Lisa Bond is a 31 y.o. female.  Patient here for wound recheck.  Had abscess drained from her glutes yesterday.  She has been on antibiotics.  Denies any fevers or chills.  Denies any abdominal pain, shortness of breath.  She continues to have drainage from the wound that was drained yesterday.  The history is provided by the patient.      Home Medications Prior to Admission medications   Medication Sig Start Date End Date Taking? Authorizing Provider  acetaminophen (TYLENOL) 500 MG tablet Take 2 tablets (1,000 mg total) by mouth every 6 (six) hours as needed for mild pain or moderate pain. Patient not taking: Reported on 03/08/2017 08/08/16   Katrinka Blazing [place], CNM  albuterol (PROVENTIL HFA) 108 [phone] Base) MCG/ACT inhaler Inhale 1-2 puffs into the lungs every 6 (six) hours as needed for shortness of breath.     [provider]  benzocaine (ORAJEL) 10 % mucosal gel Use as directed 1 application in the mouth or throat as needed for mouth pain. 10/22/17   Dartha Lodge, PA-C  clotrimazole (LOTRIMIN) 1 % cream Apply to affected area 2 times daily until resolution. 03/05/19   Alvira Monday, MD  fluticasone-salmeterol (ADVAIR HFA) 230-21 MCG/ACT inhaler Inhale 2 puffs into the lungs daily as needed (SOB).     [provider]  ibuprofen (ADVIL,MOTRIN) 600 MG tablet Take 1 tablet (600 mg total) by mouth every 6 (six) hours as needed. 10/22/17   Dartha Lodge, PA-C  ibuprofen (ADVIL,MOTRIN) 800 MG tablet Take 1 tablet (800 mg total) by mouth 3 (three) times daily with meals. 09/08/17   Caccavale, Sophia, PA-C  ondansetron (ZOFRAN ODT) 4 MG disintegrating tablet Take 1 tablet (4 mg total) by mouth every 8 (eight) hours as needed for nausea or vomiting. 01/13/21   Little, Ambrose [place], MD  oxyCODONE-acetaminophen  (PERCOCET/ROXICET) 5-325 MG tablet Take 2 tablets by mouth every 4 (four) hours as needed (pain scale > 7). 03/13/17   Philip Aspen, DO  phenol (CHLORASEPTIC) 1.4 % LIQD Use as directed 1 spray in the mouth or throat as needed for throat irritation / pain. 09/08/17   Caccavale, Sophia, PA-C  Prenat w/o A Vit-FeFum-FePo-FA (CONCEPT OB) 130-92.4-1 MG CAPS Take 1 tablet by mouth daily. 08/08/16   Katrinka Blazing, [place], CNM  ranitidine (ZANTAC) 150 MG tablet Take 150 mg by mouth 2 (two) times daily.    [provider]      Allergies    Amoxicillin and Penicillins    Review of Systems   Review of Systems  Physical Exam Updated Vital Signs BP 131/75 (BP Location: Right Arm)    Pulse 82    Temp 98.3 F (36.8 C) (Oral)    Resp 18    Ht 5\' 2"  (1.575 m)    Wt 71.7 kg    LMP 11/01/2021    SpO2 100%    BMI 28.90 kg/m  Physical Exam Constitutional:      General: She is not in acute distress.    Appearance: She is not ill-appearing.  Skin:    Comments: Abscess site in the gluteal crest overall is clean, dry, intact with no surrounding fluctuance or erythema  Neurological:     Mental Status: She is alert.    ED Results / Procedures / Treatments   Labs (  all labs ordered are listed, but only abnormal results are displayed) Labs Reviewed - No data to display  EKG None  Radiology No results found.  Procedures Procedures    Medications Ordered in ED Medications - No data to display  ED Course/ Medical Decision Making/ A&P                           Medical Decision Making  Lisa Bond is here for wound recheck.  States that her packing came out this morning.  She had abscess drained yesterday.  She has no fever.  Overall appears well.  Abscess site is clean, dry, intact with no surrounding erythema or fluctuance.  Continue warm water soaks, Tylenol, ibuprofen.  Given work note.  Discharged in good condition.  This chart was dictated using voice recognition software.  Despite  best efforts to proofread,  errors can occur which can change the documentation meaning.         Final Clinical Impression(s) / ED Diagnoses Final diagnoses:  Wound check, abscess    Rx / DC Orders ED Discharge Orders     None         Virgina Norfolk, DO 11/24/21 6767

## 2022-02-03 ENCOUNTER — Other Ambulatory Visit: Payer: Self-pay

## 2022-02-03 ENCOUNTER — Emergency Department (HOSPITAL_BASED_OUTPATIENT_CLINIC_OR_DEPARTMENT_OTHER)
Admission: EM | Admit: 2022-02-03 | Discharge: 2022-02-03 | Disposition: A | Discharge status: Home / Self Care | Payer: Self-pay | Attending: Emergency Medicine | Admitting: Emergency Medicine

## 2022-02-03 ENCOUNTER — Encounter (HOSPITAL_BASED_OUTPATIENT_CLINIC_OR_DEPARTMENT_OTHER): Payer: Self-pay | Admitting: Emergency Medicine

## 2022-02-03 DIAGNOSIS — K047 Periapical abscess without sinus: Secondary | ICD-10-CM | POA: Insufficient documentation

## 2022-02-03 MED ORDER — CLINDAMYCIN HCL 150 MG PO CAPS: 300.0000 mg | ORAL_CAPSULE | Freq: Four times a day (QID) | ORAL | 0 refills | Status: AC

## 2022-02-03 MED ORDER — NAPROXEN 250 MG PO TABS
500.0000 mg | ORAL_TABLET | Freq: Once | ORAL | Status: AC
  Administered 2022-02-03: 500 mg via ORAL
  Filled 2022-02-03: qty 2

## 2022-02-03 NOTE — ED Provider Notes (Signed)
?[facility] ?Provider Note ? ? ?CSN: [id number] ?Arrival date & time: 02/03/22  0740 ? ?  ? ?History ? ?Chief Complaint  ?Patient presents with  ? Dental Pain  ? ? ?Lisa Bond is a 31 y.o. female. ? ?HPI ? ?  ? ?31 year old patient comes in with chief complaint of right lower toothache for 2 days.  Patient indicates that she has history of poor dentition in that area, and the tooth had split at some point.  It was not a problem until 2 days ago when she started having increased pain.  She could not sleep well because of the pain last night.  There is sensitivity to heat and cold.  No nausea, vomiting, fevers, chills. ? ?Home Medications ?Prior to Admission medications   ?Medication Sig Start Date End Date Taking? Authorizing Provider  ?clindamycin (CLEOCIN) 150 MG capsule Take 2 capsules (300 mg total) by mouth every 6 (six) hours for 7 days. 02/03/22 02/10/22 Yes Varney Biles, MD  ?acetaminophen (TYLENOL) 500 MG tablet Take 2 tablets (1,000 mg total) by mouth every 6 (six) hours as needed for mild pain or moderate pain. ?Patient not taking: Reported on 03/08/2017 08/08/16   Manya Silvas, [place]  ?albuterol (PROVENTIL HFA) 108 (90 Base) MCG/ACT inhaler Inhale 1-2 puffs into the lungs every 6 (six) hours as needed for shortness of breath.     [provider]  ?benzocaine (ORAJEL) 10 % mucosal gel Use as directed 1 application in the mouth or throat as needed for mouth pain. 10/22/17   Jacqlyn Larsen, PA-C  ?clotrimazole (LOTRIMIN) 1 % cream Apply to affected area 2 times daily until resolution. 03/05/19   Gareth Morgan, MD  ?fluticasone-salmeterol (ADVAIR HFA) 230-21 MCG/ACT inhaler Inhale 2 puffs into the lungs daily as needed (SOB).     [provider]  ?ibuprofen (ADVIL,MOTRIN) 600 MG tablet Take 1 tablet (600 mg total) by mouth every 6 (six) hours as needed. 10/22/17   Jacqlyn Larsen, PA-C  ?ibuprofen (ADVIL,MOTRIN) 800 MG tablet Take 1 tablet (800 mg total) by  mouth 3 (three) times daily with meals. 09/08/17   Caccavale, Sophia, PA-C  ?ondansetron (ZOFRAN ODT) 4 MG disintegrating tablet Take 1 tablet (4 mg total) by mouth every 8 (eight) hours as needed for nausea or vomiting. 01/13/21   Little, Wenda Overland, MD  ?oxyCODONE-acetaminophen (PERCOCET/ROXICET) 5-325 MG tablet Take 2 tablets by mouth every 4 (four) hours as needed (pain scale > 7). 03/13/17   Allyn Kenner, DO  ?phenol (CHLORASEPTIC) 1.4 % LIQD Use as directed 1 spray in the mouth or throat as needed for throat irritation / pain. 09/08/17   Caccavale, Sophia, PA-C  ?Prenat w/o A Vit-FeFum-FePo-FA (CONCEPT OB) 130-92.4-1 MG CAPS Take 1 tablet by mouth daily. 08/08/16   Manya Silvas, [place]  ?ranitidine (ZANTAC) 150 MG tablet Take 150 mg by mouth 2 (two) times daily.    [provider]  ?   ? ?Allergies    ?Amoxicillin and Penicillins   ? ?Review of Systems   ?Review of Systems ? ?Physical Exam ?Updated Vital Signs ?BP 128/83   Pulse 83   Temp 98.3 ?F (36.8 ?C)   Resp 18   Ht 5\' 2"  (1.575 m)   Wt 79.4 kg   LMP 01/08/2022 (Exact Date)   SpO2 100%   BMI 32.01 kg/m?  ?Physical Exam ?Vitals and nursing note reviewed.  ?Constitutional:   ?   Appearance: She is well-developed.  ?HENT:  ?  Head: Atraumatic.  ?   Mouth/Throat:  ?   Comments: Tooth #27-29 have some erosion.  #28 appears to be split, there is no gingival abscess, no trismus. ?Cardiovascular:  ?   Rate and Rhythm: Normal rate.  ?Pulmonary:  ?   Effort: Pulmonary effort is normal.  ?Musculoskeletal:  ?   Cervical back: Normal range of motion and neck supple.  ?Skin: ?   General: Skin is warm and dry.  ?Neurological:  ?   Mental Status: She is alert and oriented to person, place, and time.  ? ? ?ED Results / Procedures / Treatments   ?Labs ?(all labs ordered are listed, but only abnormal results are displayed) ?Labs Reviewed - No data to display ? ?EKG ?None ? ?Radiology ?No results found. ? ?Procedures ?Procedures  ? ? ?Medications  Ordered in ED ?Medications  ?naproxen (NAPROSYN) tablet 500 mg (has no administration in time range)  ? ? ?ED Course/ Medical Decision Making/ A&P ?  ?                        ?Medical Decision Making ?Risk ?Prescription drug management. ? ? ?31 year old patient comes in with chief complaint of toothache.  She is having right-sided lower tooth pain, with no trismus, no stridor, no neck swelling or difficulty in breathing or swallowing.  Patient is immunocompetent.  There is no abscess for [place] to drain.  Based on exam, it appears that she likely has periapical infection.  We will start her on antibiotics, advised NSAIDs and have her follow-up with dentist as soon as possible. ? ?Final Clinical Impression(s) / ED Diagnoses ?Final diagnoses:  ?Dental infection  ? ? ?Rx / DC Orders ?ED Discharge Orders   ? ?      Ordered  ?  clindamycin (CLEOCIN) 150 MG capsule  Every 6 hours       ? 02/03/22 [id number]  ? ?  ?  ? ?  ? ? ?  ?Varney Biles, MD ?02/03/22 [phone] ? ?

## 2022-02-03 NOTE — ED Triage Notes (Signed)
Right lower toothache x 2 days . Mild swelling to right cheek  ?

## 2022-02-03 NOTE — Discharge Instructions (Addendum)
You are seen in the ER for toothache. ?Our exam is consistent with most likely inflammation and infection at the base of your tooth.  Take the antibiotic that is prescribed, starting in the anti-inflammatory medication every 6 hours. ? ?Call the dentist on call today.  They should be able to see you quickly and also most likely work with you on a payment plan. ? ? ?

## 2022-02-26 ENCOUNTER — Emergency Department (HOSPITAL_BASED_OUTPATIENT_CLINIC_OR_DEPARTMENT_OTHER)
Admission: EM | Admit: 2022-02-26 | Discharge: 2022-02-26 | Disposition: A | Discharge status: Home / Self Care | Payer: Self-pay | Attending: Emergency Medicine | Admitting: Emergency Medicine

## 2022-02-26 ENCOUNTER — Encounter (HOSPITAL_BASED_OUTPATIENT_CLINIC_OR_DEPARTMENT_OTHER): Payer: Self-pay | Admitting: Pediatrics

## 2022-02-26 ENCOUNTER — Other Ambulatory Visit: Payer: Self-pay

## 2022-02-26 DIAGNOSIS — K0889 Other specified disorders of teeth and supporting structures: Secondary | ICD-10-CM | POA: Insufficient documentation

## 2022-02-26 MED ORDER — NAPROXEN 250 MG PO TABS
500.0000 mg | ORAL_TABLET | Freq: Once | ORAL | Status: AC
  Administered 2022-02-26: 500 mg via ORAL
  Filled 2022-02-26: qty 2

## 2022-02-26 MED ORDER — NAPROXEN 500 MG PO TABS: 500.0000 mg | ORAL_TABLET | Freq: Two times a day (BID) | ORAL | 0 refills | Status: DC

## 2022-02-26 NOTE — ED Provider Notes (Signed)
[facility] EMERGENCY DEPARTMENT Provider Note   CSN: [id number] Arrival date & time: 02/26/22  1504     History  Chief Complaint  Patient presents with   Dental Pain    Lisa Bond is a 31 y.o. female.  Patient with no pertinent past medical history presents today with complaints of dental pain.  She states that same began on 5/8 after her tooth cracked.  She subsequently came here 2 days later and was given clindamycin for same.  She states that since then she has been unable to schedule an appointment with a dentist, however she does have an appointment with the health department on Monday.  Her symptoms since then have not worsened but have persisted since onset.  She is requesting pain medication to help her make it to that appointment.  She states that when she was here previously she was given naproxen and it immediately relieved her pain.  She would like a prescription for same.  She denies any significant swelling in her mouth or trouble swallowing.  No fevers or chills.  The history is provided by the patient. No language interpreter was used.  Dental Pain     Home Medications Prior to Admission medications   Medication Sig Start Date End Date Taking? Authorizing Provider  acetaminophen (TYLENOL) 500 MG tablet Take 2 tablets (1,000 mg total) by mouth every 6 (six) hours as needed for mild pain or moderate pain. Patient not taking: Reported on 03/08/2017 08/08/16   Katrinka Blazing [place], CNM  albuterol (PROVENTIL HFA) 108 [phone] Base) MCG/ACT inhaler Inhale 1-2 puffs into the lungs every 6 (six) hours as needed for shortness of breath.     [provider]  benzocaine (ORAJEL) 10 % mucosal gel Use as directed 1 application in the mouth or throat as needed for mouth pain. 10/22/17   Dartha Lodge, PA-C  clotrimazole (LOTRIMIN) 1 % cream Apply to affected area 2 times daily until resolution. 03/05/19   Alvira Monday, MD  fluticasone-salmeterol (ADVAIR HFA) 230-21  MCG/ACT inhaler Inhale 2 puffs into the lungs daily as needed (SOB).     [provider]  ibuprofen (ADVIL,MOTRIN) 600 MG tablet Take 1 tablet (600 mg total) by mouth every 6 (six) hours as needed. 10/22/17   Dartha Lodge, PA-C  ibuprofen (ADVIL,MOTRIN) 800 MG tablet Take 1 tablet (800 mg total) by mouth 3 (three) times daily with meals. 09/08/17   Caccavale, Sophia, PA-C  ondansetron (ZOFRAN ODT) 4 MG disintegrating tablet Take 1 tablet (4 mg total) by mouth every 8 (eight) hours as needed for nausea or vomiting. 01/13/21   Little, Ambrose [place], MD  oxyCODONE-acetaminophen (PERCOCET/ROXICET) 5-325 MG tablet Take 2 tablets by mouth every 4 (four) hours as needed (pain scale > 7). 03/13/17   Philip Aspen, DO  phenol (CHLORASEPTIC) 1.4 % LIQD Use as directed 1 spray in the mouth or throat as needed for throat irritation / pain. 09/08/17   Caccavale, Sophia, PA-C  Prenat w/o A Vit-FeFum-FePo-FA (CONCEPT OB) 130-92.4-1 MG CAPS Take 1 tablet by mouth daily. 08/08/16   Katrinka Blazing, [place], CNM  ranitidine (ZANTAC) 150 MG tablet Take 150 mg by mouth 2 (two) times daily.    [provider]      Allergies    Amoxicillin and Penicillins    Review of Systems   Review of Systems  HENT:  Positive for dental problem.   All other systems reviewed and are negative.  Physical Exam Updated Vital Signs BP 118/77  Pulse 87   Temp 98.5 F (36.9 C) (Oral)   Resp 18   Ht 5\' 2"  (1.575 m)   Wt 80 kg   LMP 02/11/2022 (Exact Date)   SpO2 100%   BMI 32.26 kg/m  Physical Exam Vitals and nursing note reviewed.  Constitutional:      General: She is not in acute distress.    Appearance: Normal appearance. She is normal weight. She is not ill-appearing, toxic-appearing or diaphoretic.  HENT:     Head: Normocephalic and atraumatic.     Mouth/Throat:     Lips: Pink.     Mouth: Mucous membranes are moist.     Dentition: No gingival swelling, dental abscesses or gum lesions.     Tongue: No  lesions.     Palate: No mass.     Pharynx: Oropharynx is clear. Uvula midline.      Comments: Patient right lower tooth as specified in imaging above appears to be split.  There is poor dentition in this area with several dental caries.  No trismus or abscess visualized, no swelling present in the oropharynx or under the tongue. Cardiovascular:     Rate and Rhythm: Normal rate.  Pulmonary:     Effort: Pulmonary effort is normal. No respiratory distress.  Musculoskeletal:        General: Normal range of motion.     Cervical back: Normal range of motion.  Skin:    General: Skin is warm and dry.  Neurological:     General: No focal deficit present.     Mental Status: She is alert.  Psychiatric:        Mood and Affect: Mood normal.        Behavior: Behavior normal.    ED Results / Procedures / Treatments   Labs (all labs ordered are listed, but only abnormal results are displayed) Labs Reviewed - No data to display  EKG None  Radiology No results found.  Procedures Procedures    Medications Ordered in ED Medications  naproxen (NAPROSYN) tablet 500 mg (has no administration in time range)    ED Course/ Medical Decision Making/ A&P                           Medical Decision Making Risk Prescription drug management.   Patient presents today with dental pain over the past 3 weeks.  She is afebrile, nontoxic-appearing, and in no acute distress with reassuring vital signs. No gross abscess visualized on exam.  Exam also unconcerning for Ludwig's angina or spread of infection.  She was already treated with clindamycin for infection which she filled and completed, no additional antibiotics are indicated at this time.  Patient given naproxen with significant improvement today.  Will send home with a prescription for same. Urged patient to follow-up with dentist.  Patient is understanding and amenable with plan, educated on red flag symptoms of prompt immediate return.  Discharged  in stable condition.   Final Clinical Impression(s) / ED Diagnoses Final diagnoses:  Pain, dental    Rx / DC Orders ED Discharge Orders          Ordered    naproxen (NAPROSYN) 500 MG tablet  2 times daily        02/26/22 1550          An After Visit Summary was printed and given to the patient.     Silva BandySmoot, Alexine Pilant A, PA-C 02/26/22 1552  Rolan Bucco, MD 02/26/22 [phone]

## 2022-02-26 NOTE — Discharge Instructions (Addendum)
As we discussed, I have given you a prescription for pain medication to help with your tooth ache.  Please fill this prescription and take as prescribed as needed.  The only way to have permanent relief of your symptoms is to see a dentist.  I have attached a resource guide to help you with this.  Return if development of any new or worsening symptoms.

## 2022-02-26 NOTE — ED Triage Notes (Signed)
C/O toothache

## 2022-03-15 ENCOUNTER — Emergency Department (HOSPITAL_BASED_OUTPATIENT_CLINIC_OR_DEPARTMENT_OTHER)
Admission: EM | Admit: 2022-03-15 | Discharge: 2022-03-15 | Disposition: A | Discharge status: Home / Self Care | Payer: Medicaid Other | Attending: Emergency Medicine | Admitting: Emergency Medicine

## 2022-03-15 ENCOUNTER — Other Ambulatory Visit: Payer: Self-pay

## 2022-03-15 ENCOUNTER — Encounter (HOSPITAL_BASED_OUTPATIENT_CLINIC_OR_DEPARTMENT_OTHER): Payer: Self-pay | Admitting: Emergency Medicine

## 2022-03-15 DIAGNOSIS — B349 Viral infection, unspecified: Secondary | ICD-10-CM | POA: Insufficient documentation

## 2022-03-15 DIAGNOSIS — H109 Unspecified conjunctivitis: Secondary | ICD-10-CM | POA: Insufficient documentation

## 2022-03-15 LAB — GROUP A STREP BY PCR: Group A Strep by PCR: NOT DETECTED

## 2022-03-15 MED ORDER — LIDOCAINE VISCOUS HCL 2 % MT SOLN
15.0000 mL | Freq: Once | OROMUCOSAL | Status: AC
Start: 2022-03-15 — End: 2022-03-15
  Administered 2022-03-15: 15 mL via OROMUCOSAL
  Filled 2022-03-15: qty 15

## 2022-03-15 NOTE — ED Provider Notes (Signed)
[facility] EMERGENCY DEPARTMENT Provider Note   CSN: [id number] Arrival date & time: 03/15/22  0809     History  Chief Complaint  Patient presents with   Eye Problem   Sore Throat    Lisa Bond is a 31 y.o. female.  Presents for sore throat and redness in eye.  Patient reports that both the throat and eye Lisa Bond had been going on for couple days.  The eye is not painful but is slightly irritated, somewhat itchy.  Has noted redness.  No significant drainage, no pus coming from the eye.  Lisa Bond denies any blurriness in the eye.  No trauma to the eye.  Lisa Bond also has had a sore throat, still able to eat and drink.  No difficulty breathing.  No significant cough.  HPI     Home Medications Prior to Admission medications   Medication Sig Start Date End Date Taking? Authorizing Provider  acetaminophen (TYLENOL) 500 MG tablet Take 2 tablets (1,000 mg total) by mouth every 6 (six) hours as needed for mild pain or moderate pain. Patient not taking: Reported on 03/08/2017 08/08/16   Katrinka Blazing [place], CNM  albuterol (PROVENTIL HFA) 108 [phone] Base) MCG/ACT inhaler Inhale 1-2 puffs into the lungs every 6 (six) hours as needed for shortness of breath.     [provider]  benzocaine (ORAJEL) 10 % mucosal gel Use as directed 1 application in the mouth or throat as needed for mouth pain. 10/22/17   Dartha Lodge, PA-C  clotrimazole (LOTRIMIN) 1 % cream Apply to affected area 2 times daily until resolution. 03/05/19   Alvira Monday, MD  fluticasone-salmeterol (ADVAIR HFA) 230-21 MCG/ACT inhaler Inhale 2 puffs into the lungs daily as needed (SOB).     [provider]  ibuprofen (ADVIL,MOTRIN) 600 MG tablet Take 1 tablet (600 mg total) by mouth every 6 (six) hours as needed. 10/22/17   Dartha Lodge, PA-C  ibuprofen (ADVIL,MOTRIN) 800 MG tablet Take 1 tablet (800 mg total) by mouth 3 (three) times daily with meals. 09/08/17   Caccavale, Sophia, PA-C  naproxen (NAPROSYN) 500 MG  tablet Take 1 tablet (500 mg total) by mouth 2 (two) times daily. 02/26/22   Smoot, Sarah A, PA-C  ondansetron (ZOFRAN ODT) 4 MG disintegrating tablet Take 1 tablet (4 mg total) by mouth every 8 (eight) hours as needed for nausea or vomiting. 01/13/21   Little, Ambrose [place], MD  oxyCODONE-acetaminophen (PERCOCET/ROXICET) 5-325 MG tablet Take 2 tablets by mouth every 4 (four) hours as needed (pain scale > 7). 03/13/17   Philip Aspen, DO  phenol (CHLORASEPTIC) 1.4 % LIQD Use as directed 1 spray in the mouth or throat as needed for throat irritation / pain. 09/08/17   Caccavale, Sophia, PA-C  Prenat w/o A Vit-FeFum-FePo-FA (CONCEPT OB) 130-92.4-1 MG CAPS Take 1 tablet by mouth daily. 08/08/16   Katrinka Blazing, [place], CNM  ranitidine (ZANTAC) 150 MG tablet Take 150 mg by mouth 2 (two) times daily.    [provider]      Allergies    Amoxicillin and Penicillins    Review of Systems   Review of Systems  Constitutional:  Negative for chills and fever.  HENT:  Positive for sore throat. Negative for ear pain.   Eyes:  Positive for itching. Negative for pain and visual disturbance.  Respiratory:  Negative for cough and shortness of breath.   Cardiovascular:  Negative for chest pain and palpitations.  Gastrointestinal:  Negative for abdominal pain and vomiting.  Genitourinary:  Negative for dysuria and hematuria.  Musculoskeletal:  Negative for arthralgias and back pain.  Skin:  Negative for color change and rash.  Neurological:  Negative for seizures and syncope.  All other systems reviewed and are negative.   Physical Exam Updated Vital Signs BP (!) 143/89   Pulse 74   Temp 98.2 F (36.8 C) (Oral)   Resp 18   Ht 5\' 2"  (1.575 m)   Wt 74.8 kg   LMP 03/12/2022 (Exact Date)   SpO2 96%   BMI 30.18 kg/m  Physical Exam Vitals and nursing note reviewed.  Constitutional:      General: Lisa Bond is not in acute distress.    Appearance: Lisa Bond is well-developed.  HENT:     Head: Normocephalic  and atraumatic.     Mouth/Throat:     Mouth: Mucous membranes are moist.     Comments: Mild erythema in posterior pharynx but no tonsillar exudates Eyes:     Comments: There is some erythema, conjunctival injection on the right eye, pupils are equal round and reactive to light, normal brisk EOM without pain, no facial erythema around orbit  Cardiovascular:     Rate and Rhythm: Normal rate and regular rhythm.     Heart sounds: No murmur heard. Pulmonary:     Effort: Pulmonary effort is normal. No respiratory distress.     Breath sounds: Normal breath sounds.  Abdominal:     Palpations: Abdomen is soft.     Tenderness: There is no abdominal tenderness.  Musculoskeletal:        General: No swelling.     Cervical back: Neck supple.  Skin:    General: Skin is warm and dry.     Capillary Refill: Capillary refill takes less than 2 seconds.  Neurological:     Mental Status: Lisa Bond is alert.  Psychiatric:        Mood and Affect: Mood normal.     ED Results / Procedures / Treatments   Labs (all labs ordered are listed, but only abnormal results are displayed) Labs Reviewed  GROUP A STREP BY PCR    EKG None  Radiology No results found.  Procedures Procedures    Medications Ordered in ED Medications  lidocaine (XYLOCAINE) 2 % viscous mouth solution 15 mL (15 mLs Mouth/Throat Given 03/15/22 03/17/22)    ED Course/ Medical Decision Making/ A&P                           Medical Decision Making Risk Prescription drug management.   31 year old presents for right eye redness and sore throat.  On exam Lisa Bond is remarkably well-appearing in no acute distress.  I did appreciate some conjunctiva injection.  Appears to have conjunctivitis.  No surrounding facial erythema or redness around orbit to suggest cellulitis.  No pain with EOM.  Lisa Bond denies vision loss, trauma.  Normal visual acuity on visual acuity screen.  Her throat appears mildly red but no exudates.  Strep test is negative.  Given  combination of conjunctivitis and sore throat suspect strongly viral syndrome.  Provided information for ophthalmology if redness not clearing over the next couple days.  Reviewed return precautions with patient if symptoms are worsening.   After the discussed management above, the patient was determined to be safe for discharge.  The patient was in agreement with this plan and all questions regarding their care were answered.  ED return precautions were discussed and the patient will  return to the ED with any significant worsening of condition.         Final Clinical Impression(s) / ED Diagnoses Final diagnoses:  Conjunctivitis of right eye, unspecified conjunctivitis type  Acute viral syndrome    Rx / DC Orders ED Discharge Orders     None         Milagros Loll, MD 03/15/22 1005

## 2022-03-15 NOTE — Discharge Instructions (Signed)
Recommend using over-the-counter natural tears, eye drop for comfort.  If eye redness symptoms not resolving over the next couple days then follow-up with ophthalmologist for recheck. If you develop blurriness in the eye, loss of vision, difficulty breathing, fevers or other new concerning symptom, return to ER for reassessment.

## 2022-03-15 NOTE — ED Triage Notes (Signed)
Pt arrives pov, steady gait c/o right eye pain with drainage and sore throat x 2 days. Denies fever

## 2022-04-10 ENCOUNTER — Emergency Department (HOSPITAL_BASED_OUTPATIENT_CLINIC_OR_DEPARTMENT_OTHER)
Admission: EM | Admit: 2022-04-10 | Discharge: 2022-04-10 | Disposition: A | Discharge status: Home / Self Care | Payer: Medicaid Other | Attending: Emergency Medicine | Admitting: Emergency Medicine

## 2022-04-10 ENCOUNTER — Other Ambulatory Visit: Payer: Self-pay

## 2022-04-10 DIAGNOSIS — K029 Dental caries, unspecified: Secondary | ICD-10-CM | POA: Insufficient documentation

## 2022-04-10 DIAGNOSIS — K0889 Other specified disorders of teeth and supporting structures: Secondary | ICD-10-CM

## 2022-04-10 MED ORDER — OXYCODONE-ACETAMINOPHEN 5-325 MG PO TABS: 1.0000 | ORAL_TABLET | Freq: Four times a day (QID) | ORAL | 0 refills | Status: DC | PRN

## 2022-04-10 MED ORDER — IBUPROFEN 800 MG PO TABS
800.0000 mg | ORAL_TABLET | Freq: Once | ORAL | Status: AC
  Administered 2022-04-10: 800 mg via ORAL
  Filled 2022-04-10: qty 1

## 2022-04-10 MED ORDER — ACETAMINOPHEN 325 MG PO TABS
650.0000 mg | ORAL_TABLET | Freq: Once | ORAL | Status: AC
  Administered 2022-04-10: 650 mg via ORAL
  Filled 2022-04-10: qty 2

## 2022-04-10 NOTE — ED Notes (Signed)
Patient states that she has pain rt lower teeth and pain left upper teeth.States that rt teeth has being hurting for two months The patient left the office before the visit was finished. Teeth just started hurting yesterday. Patient states the naproxen is not helping pain and she cant she dentist until September. States that she is currently taking clindamycin also. Rt side of face is swollen

## 2022-04-10 NOTE — Discharge Instructions (Signed)
Please use Tylenol or ibuprofen for pain.  You may use 600 mg ibuprofen every 6 hours or 1000 mg of Tylenol every 6 hours.  You may choose to alternate between the 2.  This would be most effective.  Not to exceed 4 g of Tylenol within 24 hours.  Not to exceed 3200 mg ibuprofen 24 hours.  Please continue taking the antibiotics you are prescribed, using oral Orajel.  You can use the stronger pain medication that prescribing for severe breakthrough pain.  Please follow-up with dentistry.  I have attached a list of dental resources that you can use to try to get an earlier appointment than September.
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
## 2022-04-10 NOTE — ED Triage Notes (Addendum)
Pt arrives pov, c/o dental pain. Pt endorses recent tx for same. Reports naproxen not helping pain. Pt reports dental appt is 2 months out.

## 2022-04-19 ENCOUNTER — Encounter (HOSPITAL_BASED_OUTPATIENT_CLINIC_OR_DEPARTMENT_OTHER): Payer: Self-pay | Admitting: Emergency Medicine

## 2022-04-19 ENCOUNTER — Emergency Department (HOSPITAL_BASED_OUTPATIENT_CLINIC_OR_DEPARTMENT_OTHER): Payer: Medicaid Other

## 2022-04-19 ENCOUNTER — Other Ambulatory Visit: Payer: Self-pay

## 2022-04-19 ENCOUNTER — Emergency Department (HOSPITAL_BASED_OUTPATIENT_CLINIC_OR_DEPARTMENT_OTHER)
Admission: EM | Admit: 2022-04-19 | Discharge: 2022-04-19 | Disposition: A | Discharge status: Home / Self Care | Payer: Self-pay | Attending: Emergency Medicine | Admitting: Emergency Medicine

## 2022-04-19 DIAGNOSIS — Y92410 Unspecified street and highway as the place of occurrence of the external cause: Secondary | ICD-10-CM | POA: Insufficient documentation

## 2022-04-19 DIAGNOSIS — M542 Cervicalgia: Secondary | ICD-10-CM | POA: Insufficient documentation

## 2022-04-19 DIAGNOSIS — J45909 Unspecified asthma, uncomplicated: Secondary | ICD-10-CM | POA: Insufficient documentation

## 2022-04-19 DIAGNOSIS — M25561 Pain in right knee: Secondary | ICD-10-CM | POA: Diagnosis not present

## 2022-04-19 DIAGNOSIS — M545 Low back pain, unspecified: Secondary | ICD-10-CM | POA: Insufficient documentation

## 2022-04-19 MED ORDER — CYCLOBENZAPRINE HCL 10 MG PO TABS
10.0000 mg | ORAL_TABLET | Freq: Two times a day (BID) | ORAL | 0 refills | Status: DC | PRN
Start: 2022-04-19 — End: 2024-06-30
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
## 2022-04-19 NOTE — ED Notes (Signed)
Dc instructions and scripts reviewed with pt no questions or concerns at this time. Will follow up as needed. Ambulated with steady gait out of ed.
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
## 2022-07-18 ENCOUNTER — Other Ambulatory Visit: Payer: Self-pay

## 2022-07-18 ENCOUNTER — Emergency Department (HOSPITAL_BASED_OUTPATIENT_CLINIC_OR_DEPARTMENT_OTHER): Payer: Self-pay

## 2022-07-18 ENCOUNTER — Encounter (HOSPITAL_BASED_OUTPATIENT_CLINIC_OR_DEPARTMENT_OTHER): Payer: Self-pay | Admitting: Emergency Medicine

## 2022-07-18 ENCOUNTER — Emergency Department (HOSPITAL_BASED_OUTPATIENT_CLINIC_OR_DEPARTMENT_OTHER)
Admission: EM | Admit: 2022-07-18 | Discharge: 2022-07-18 | Disposition: A | Discharge status: Home / Self Care | Payer: Self-pay | Attending: Emergency Medicine | Admitting: Emergency Medicine

## 2022-07-18 DIAGNOSIS — N939 Abnormal uterine and vaginal bleeding, unspecified: Secondary | ICD-10-CM | POA: Insufficient documentation

## 2022-07-18 DIAGNOSIS — R102 Pelvic and perineal pain: Secondary | ICD-10-CM | POA: Insufficient documentation

## 2022-07-18 LAB — URINALYSIS, MICROSCOPIC (REFLEX)
Bacteria, UA: NONE SEEN
WBC, UA: NONE SEEN WBC/hpf (ref 0–5)

## 2022-07-18 LAB — URINALYSIS, ROUTINE W REFLEX MICROSCOPIC
Bilirubin Urine: NEGATIVE
Glucose, UA: NEGATIVE mg/dL
Ketones, ur: NEGATIVE mg/dL
Leukocytes,Ua: NEGATIVE
Nitrite: NEGATIVE
Protein, ur: NEGATIVE mg/dL
Specific Gravity, Urine: 1.02 (ref 1.005–1.030)
pH: 7 (ref 5.0–8.0)

## 2022-07-18 LAB — COMPREHENSIVE METABOLIC PANEL
ALT: 52 U/L — ABNORMAL HIGH (ref 0–44)
AST: 31 U/L (ref 15–41)
Albumin: 3.6 g/dL (ref 3.5–5.0)
Alkaline Phosphatase: 56 U/L (ref 38–126)
Anion gap: 4 — ABNORMAL LOW (ref 5–15)
BUN: 14 mg/dL (ref 6–20)
CO2: 23 mmol/L (ref 22–32)
Calcium: 8.2 mg/dL — ABNORMAL LOW (ref 8.9–10.3)
Chloride: 109 mmol/L (ref 98–111)
Creatinine, Ser: 0.59 mg/dL (ref 0.44–1.00)
GFR, Estimated: >60 mL/min (ref >60)
Glucose, Bld: 94 mg/dL (ref 70–99)
Potassium: 3.8 mmol/L (ref 3.5–5.1)
Sodium: 136 mmol/L (ref 135–145)
Total Bilirubin: 0.3 mg/dL (ref 0.3–1.2)
Total Protein: 7.3 g/dL (ref 6.5–8.1)

## 2022-07-18 LAB — CBC
HCT: 36.4 % (ref 36.0–46.0)
Hemoglobin: 12.1 g/dL (ref 12.0–15.0)
MCH: 30.4 pg (ref 26.0–34.0)
MCHC: 33.2 g/dL (ref 30.0–36.0)
MCV: 91.5 fL (ref 80.0–100.0)
Platelets: 233 10*3/uL (ref 150–400)
RBC: 3.98 MIL/uL (ref 3.87–5.11)
RDW: 13.2 % (ref 11.5–15.5)
WBC: 5.4 10*3/uL (ref 4.0–10.5)
nRBC: 0 % (ref 0.0–0.2)

## 2022-07-18 LAB — HCG, QUANTITATIVE, PREGNANCY: hCG, Beta Chain, Quant, S: 4059 m[IU]/mL — ABNORMAL HIGH (ref <5)

## 2022-07-18 MED ORDER — MISOPROSTOL 200 MCG PO TABS
800.0000 ug | ORAL_TABLET | Freq: Once | ORAL | Status: AC
  Administered 2022-07-18: 800 ug via ORAL
  Filled 2022-07-18: qty 4

## 2022-07-18 MED ORDER — OXYCODONE-ACETAMINOPHEN 5-325 MG PO TABS: 1.0000 | ORAL_TABLET | Freq: Once | ORAL | Status: DC

## 2022-07-18 MED ORDER — OXYCODONE-ACETAMINOPHEN 5-325 MG PO TABS: 1.0000 | ORAL_TABLET | Freq: Three times a day (TID) | ORAL | 0 refills | Status: DC | PRN

## 2022-07-18 MED ORDER — MORPHINE SULFATE (PF) 2 MG/ML IV SOLN
2.0000 mg | Freq: Once | INTRAVENOUS | Status: AC
  Administered 2022-07-18: 2 mg via INTRAVENOUS
  Filled 2022-07-18: qty 1

## 2022-07-18 NOTE — Discharge Instructions (Signed)
As we discussed, your work-up in the ER did show some concerns for retained products of conception in your uterus versus retained blood clots.  I have given you misoprostol in the ER today to help your body get rid of this.  This is a medication given for abortion and therefore will cause cramping and pain.  For this I have given you a prescription for Percocet which is a narcotic for you to take as prescribed as needed for severe pain only.  Do not drive or operate heavy machinery while taking this medication.  [facility] which I have given you a referral with a number to call to schedule an appointment.  Return if development of any new or worsening symptoms.

## 2022-07-18 NOTE — ED Provider Notes (Signed)
[facility] EMERGENCY DEPARTMENT Provider Note   CSN: [id number] Arrival date & time: 07/18/22  1132     History  Chief Complaint  Patient presents with   Abdominal Pain    Lisa TURCK is a 31 y.o. female.  Patient with history of C-section and preeclampsia presents today with complaints of low pelvic pain.  She states that last Tuesday she had a D&C abortion at [redacted] weeks gestation at [facility] that was without complication.  States that she was sent home without pain medication and was pain-free until yesterday when she developed generalized lower pelvic pressure that has been persistent since then.  She also states that she has had some bleeding over the last 3 days.  States this is her second D&C abortion and previously she did not have any pain or bleeding following.  She is G3P1.  She has not followed up with [facility].  Denies any fevers, chills, nausea, vomiting, diarrhea, hematuria, or dysuria.  The history is provided by the patient. No language interpreter was used.  Abdominal Pain Associated symptoms: vaginal bleeding        Home Medications Prior to Admission medications   Medication Sig Start Date End Date Taking? Authorizing Provider  acetaminophen (TYLENOL) 500 MG tablet Take 2 tablets (1,000 mg total) by mouth every 6 (six) hours as needed for mild pain or moderate pain. Patient not taking: Reported on 03/08/2017 08/08/16   Katrinka Blazing [place], CNM  albuterol (PROVENTIL HFA) 108 [phone] Base) MCG/ACT inhaler Inhale 1-2 puffs into the lungs every 6 (six) hours as needed for shortness of breath.     [provider]  benzocaine (ORAJEL) 10 % mucosal gel Use as directed 1 application in the mouth or throat as needed for mouth pain. 10/22/17   Dartha Lodge, PA-C  clotrimazole (LOTRIMIN) 1 % cream Apply to affected area 2 times daily until resolution. 03/05/19   Alvira Monday, MD  cyclobenzaprine (FLEXERIL) 10 MG tablet Take 1 tablet (10 mg  total) by mouth 2 (two) times daily as needed for muscle spasms. 04/19/22   Al Decant, PA-C  fluticasone-salmeterol (ADVAIR HFA) 230-21 MCG/ACT inhaler Inhale 2 puffs into the lungs daily as needed (SOB).     [provider]  ibuprofen (ADVIL,MOTRIN) 600 MG tablet Take 1 tablet (600 mg total) by mouth every 6 (six) hours as needed. 10/22/17   Dartha Lodge, PA-C  ibuprofen (ADVIL,MOTRIN) 800 MG tablet Take 1 tablet (800 mg total) by mouth 3 (three) times daily with meals. 09/08/17   Caccavale, Sophia, PA-C  naproxen (NAPROSYN) 500 MG tablet Take 1 tablet (500 mg total) by mouth 2 (two) times daily. 02/26/22   Steed Kanaan A, PA-C  ondansetron (ZOFRAN ODT) 4 MG disintegrating tablet Take 1 tablet (4 mg total) by mouth every 8 (eight) hours as needed for nausea or vomiting. 01/13/21   Little, Ambrose [place], MD  oxyCODONE-acetaminophen (PERCOCET/ROXICET) 5-325 MG tablet Take 1 tablet by mouth every 6 (six) hours as needed for severe pain. 04/10/22   Prosperi, Christian H, PA-C  phenol (CHLORASEPTIC) 1.4 % LIQD Use as directed 1 spray in the mouth or throat as needed for throat irritation / pain. 09/08/17   Caccavale, Sophia, PA-C  Prenat w/o A Vit-FeFum-FePo-FA (CONCEPT OB) 130-92.4-1 MG CAPS Take 1 tablet by mouth daily. 08/08/16   Katrinka Blazing, [place], CNM  ranitidine (ZANTAC) 150 MG tablet Take 150 mg by mouth 2 (two) times daily.    [provider]  Allergies    Amoxicillin and Penicillins    Review of Systems   Review of Systems  Genitourinary:  Positive for pelvic pain and vaginal bleeding.  All other systems reviewed and are negative.   Physical Exam Updated Vital Signs BP 124/80   Pulse 89   Temp 98.3 F (36.8 C) (Oral)   Resp 20   Ht 5\' 2"  (1.575 m)   Wt 81.6 kg   LMP 07/15/2022   SpO2 98%   BMI 32.92 kg/m  Physical Exam Vitals and nursing note reviewed.  Constitutional:      General: She is not in acute distress.    Appearance: Normal appearance.  She is normal weight. She is not ill-appearing, toxic-appearing or diaphoretic.  HENT:     Head: Normocephalic and atraumatic.  Cardiovascular:     Rate and Rhythm: Normal rate.  Pulmonary:     Effort: Pulmonary effort is normal. No respiratory distress.  Abdominal:     General: Abdomen is flat.     Palpations: Abdomen is soft.     Tenderness: There is abdominal tenderness in the suprapubic area. There is no right CVA tenderness, left CVA tenderness, guarding or rebound.  Musculoskeletal:        General: Normal range of motion.     Cervical back: Normal range of motion.  Skin:    General: Skin is warm and dry.  Neurological:     General: No focal deficit present.     Mental Status: She is alert.  Psychiatric:        Mood and Affect: Mood normal.        Behavior: Behavior normal.     ED Results / Procedures / Treatments   Labs (all labs ordered are listed, but only abnormal results are displayed) Labs Reviewed  COMPREHENSIVE METABOLIC PANEL - Abnormal; Notable for the following components:      Result Value   Calcium 8.2 (*)    ALT 52 (*)    Anion gap 4 (*)    All other components within normal limits  URINALYSIS, ROUTINE W REFLEX MICROSCOPIC - Abnormal; Notable for the following components:   APPearance HAZY (*)    Hgb urine dipstick LARGE (*)    All other components within normal limits  HCG, QUANTITATIVE, PREGNANCY - Abnormal; Notable for the following components:   hCG, Beta Chain, Quant, S 4,059 (*)    All other components within normal limits  CBC  URINALYSIS, MICROSCOPIC (REFLEX)    EKG None  Radiology 07/17/2022 PELVIC COMPLETE WITH TRANSVAGINAL  Result Date: 07/18/2022 CLINICAL DATA:  Pelvic pain, vaginal bleeding x3 days EXAM: TRANSABDOMINAL AND TRANSVAGINAL ULTRASOUND OF PELVIS TECHNIQUE: Both transabdominal and transvaginal ultrasound examinations of the pelvis were performed. Transabdominal technique was performed for global imaging of the pelvis including  uterus, ovaries, adnexal regions, and pelvic cul-de-sac. It was necessary to proceed with endovaginal exam following the transabdominal exam to visualize the endometrium and ovaries. COMPARISON:  None Available. FINDINGS: Uterus Measurements: 10.4 x 5.2 x 6.8 cm = volume: 194 mL. There is no demonstrable intrauterine gestational sac. Endometrium Thickness: 1.4 cm. There is inhomogeneous echogenicity in the endometrium with fluid and echogenic material. In color Doppler examination. There is questionable vascularity in the endometrium. Right ovary Measurements: 3.3 x 2 x 2.3 cm = volume: 8 mL. Normal appearance/no adnexal mass. Left ovary Measurements: 2.6 x 1.7 x 2.7 cm = volume: 6.5 mL. Normal appearance/no adnexal mass. Other findings No abnormal free fluid. IMPRESSION: Endometrium is prominent  measuring 14 mm with echogenic material and trace amount of fluid in the endometrial cavity. Findings suggest possible retained products of conception or blood clots in the endometrial cavity. There are no adnexal masses.  There is no free fluid in pelvis. Electronically Signed   By: Elmer Picker M.D.   On: 07/18/2022 14:25    Procedures Procedures    Medications Ordered in ED Medications - No data to display  ED Course/ Medical Decision Making/ A&P                           Medical Decision Making Amount and/or Complexity of Data Reviewed Labs: ordered. Radiology: ordered.  Risk Prescription drug management.   This patient is a 31 y.o. female who presents to the ED for concern of vaginal bleeding and pelvic pain after D&C abortion on Tuesday, this involves an extensive number of treatment options, and is a complaint that carries with it a high risk of complications and morbidity. The emergent differential diagnosis prior to evaluation includes, but is not limited to, endometritis, failed abortion, sepsis.   This is not an exhaustive differential.   Past Medical History / Co-morbidities /  Social History: Patient is G3 P1 status post 8-week gestation D&C abortion 5 days ago  Additional history: Chart reviewed. Pertinent results include: Patient with history of preeclampsia and C-section  Physical Exam: Physical exam performed. The pertinent findings include: Mild diffuse pelvic tenderness without rebound or guarding.  Lab Tests: I ordered, and personally interpreted labs.  The pertinent results include: No leukocytosis or anemia.  Hematuria consistent with vaginal bleeding post abortion.  hCG 4000.   Imaging Studies: I ordered imaging studies including pelvic ultrasound. I independently visualized and interpreted imaging which showed  Endometrium is prominent measuring 14 mm with echogenic material and trace amount of fluid in the endometrial cavity. Findings suggest possible retained products of conception or blood clots in the endometrial cavity. There are no adnexal masses.  There is no free fluid in pelvis.  I agree with the radiologist interpretation.     Medications: I ordered medication including morphine, Percocet, misoprostol for pain and concern for retained products of conception. Reevaluation of the patient after these medicines showed that the patient improved. I have reviewed the patients home medicines and have made adjustments as needed.  Consultations Obtained: I requested consultation with the Dr. Nehemiah Settle with OB/GYN,  and discussed lab and imaging findings as well as pertinent plan - they recommend: Discharged with misoprostol 800 mg once with Percocet for pain.  Close OB/GYN follow-up outpatient.   Disposition: After consideration of the diagnostic results and the patients response to treatment, I feel that based on OB/GYN recommendations, patient is stable to be discharged.   Patient's emergency department workup does not suggest an emergent condition requiring admission or immediate intervention beyond what has been performed at this time. The plan is:  Discharged with close OB/GYN follow-up.  Patient given Percocet for residual pain.  PDMP reviewed.  Patient is understanding that we will be suppressed will likely cause worsening low pelvic cramping and pain.  The patient is safe for discharge and has been instructed to return immediately for worsening symptoms, change in symptoms or any other concerns.  Patient is understanding and amenable with plan, discharged in stable condition.  I discussed this case with my attending physician Dr. Ronnald Nian who cosigned this note including patient's presenting symptoms, physical exam, and planned diagnostics and interventions. Attending physician  stated agreement with plan or made changes to plan which were implemented.     Final Clinical Impression(s) / ED Diagnoses Final diagnoses:  Pelvic pain in female  Vaginal bleeding    Rx / DC Orders ED Discharge Orders          Ordered    oxyCODONE-acetaminophen (PERCOCET/ROXICET) 5-325 MG tablet  Every 8 hours PRN        07/18/22 1514          An After Visit Summary was printed and given to the patient.     Vear Clock 07/18/22 1531    Virgina Norfolk, DO 07/21/22 1120

## 2022-07-18 NOTE — ED Triage Notes (Signed)
Pt endorses abortion on Tuesday, lower abdominal pain starting yesterday with vaginal bleeding x 3 days.

## 2022-07-18 NOTE — ED Notes (Addendum)
Abdominal pain since Friday  . Denies any n/v.Stomach is non distended,tender to touch

## 2022-07-20 ENCOUNTER — Other Ambulatory Visit: Payer: Self-pay

## 2022-07-20 ENCOUNTER — Emergency Department (HOSPITAL_BASED_OUTPATIENT_CLINIC_OR_DEPARTMENT_OTHER)
Admission: EM | Admit: 2022-07-20 | Discharge: 2022-07-20 | Disposition: A | Discharge status: Home / Self Care | Payer: Medicaid Other | Attending: Emergency Medicine | Admitting: Emergency Medicine

## 2022-07-20 ENCOUNTER — Telehealth: Payer: Self-pay | Admitting: Family Medicine

## 2022-07-20 ENCOUNTER — Encounter (HOSPITAL_BASED_OUTPATIENT_CLINIC_OR_DEPARTMENT_OTHER): Payer: Self-pay | Admitting: Pediatrics

## 2022-07-20 DIAGNOSIS — N898 Other specified noninflammatory disorders of vagina: Secondary | ICD-10-CM | POA: Insufficient documentation

## 2022-07-20 DIAGNOSIS — N939 Abnormal uterine and vaginal bleeding, unspecified: Secondary | ICD-10-CM | POA: Insufficient documentation

## 2022-07-20 DIAGNOSIS — R102 Pelvic and perineal pain: Secondary | ICD-10-CM | POA: Insufficient documentation

## 2022-07-20 LAB — URINALYSIS, MICROSCOPIC (REFLEX)

## 2022-07-20 LAB — CBC WITH DIFFERENTIAL/PLATELET
Abs Immature Granulocytes: 0.02 10*3/uL (ref 0.00–0.07)
Basophils Absolute: 0 10*3/uL (ref 0.0–0.1)
Basophils Relative: 1 %
Eosinophils Absolute: 0.3 10*3/uL (ref 0.0–0.5)
Eosinophils Relative: 4 %
HCT: 35.9 % — ABNORMAL LOW (ref 36.0–46.0)
Hemoglobin: 12.2 g/dL (ref 12.0–15.0)
Immature Granulocytes: 0 %
Lymphocytes Relative: 43 %
Lymphs Abs: 2.9 10*3/uL (ref 0.7–4.0)
MCH: 30.8 pg (ref 26.0–34.0)
MCHC: 34 g/dL (ref 30.0–36.0)
MCV: 90.7 fL (ref 80.0–100.0)
Monocytes Absolute: 0.5 10*3/uL (ref 0.1–1.0)
Monocytes Relative: 8 %
Neutro Abs: 3 10*3/uL (ref 1.7–7.7)
Neutrophils Relative %: 44 %
Platelets: 276 10*3/uL (ref 150–400)
RBC: 3.96 MIL/uL (ref 3.87–5.11)
RDW: 12.8 % (ref 11.5–15.5)
WBC: 6.8 10*3/uL (ref 4.0–10.5)
nRBC: 0 % (ref 0.0–0.2)

## 2022-07-20 LAB — URINALYSIS, ROUTINE W REFLEX MICROSCOPIC
Bilirubin Urine: NEGATIVE
Glucose, UA: NEGATIVE mg/dL
Ketones, ur: NEGATIVE mg/dL
Leukocytes,Ua: NEGATIVE
Nitrite: NEGATIVE
Protein, ur: NEGATIVE mg/dL
Specific Gravity, Urine: 1.025 (ref 1.005–1.030)
pH: 6 (ref 5.0–8.0)

## 2022-07-20 LAB — BASIC METABOLIC PANEL
Anion gap: 5 (ref 5–15)
BUN: 17 mg/dL (ref 6–20)
CO2: 24 mmol/L (ref 22–32)
Calcium: 8.6 mg/dL — ABNORMAL LOW (ref 8.9–10.3)
Chloride: 108 mmol/L (ref 98–111)
Creatinine, Ser: 1.03 mg/dL — ABNORMAL HIGH (ref 0.44–1.00)
GFR, Estimated: >60 mL/min (ref >60)
Glucose, Bld: 87 mg/dL (ref 70–99)
Potassium: 3.9 mmol/L (ref 3.5–5.1)
Sodium: 137 mmol/L (ref 135–145)

## 2022-07-20 LAB — HCG, QUANTITATIVE, PREGNANCY: hCG, Beta Chain, Quant, S: 2033 m[IU]/mL — ABNORMAL HIGH (ref <5)

## 2022-07-20 MED ORDER — KETOROLAC TROMETHAMINE 15 MG/ML IJ SOLN
15.0000 mg | Freq: Once | INTRAMUSCULAR | Status: AC
  Administered 2022-07-20: 15 mg via INTRAVENOUS
  Filled 2022-07-20: qty 1

## 2022-07-20 MED ORDER — HYDROMORPHONE HCL 1 MG/ML IJ SOLN
0.5000 mg | Freq: Once | INTRAMUSCULAR | Status: AC
  Administered 2022-07-20: 0.5 mg via INTRAVENOUS
  Filled 2022-07-20: qty 1

## 2022-07-20 MED ORDER — MELOXICAM 7.5 MG PO TABS: 7.5000 mg | ORAL_TABLET | Freq: Every day | ORAL | 0 refills | Status: DC

## 2022-07-20 MED ORDER — ONDANSETRON HCL 4 MG/2ML IJ SOLN
4.0000 mg | Freq: Once | INTRAMUSCULAR | Status: AC
  Administered 2022-07-20: 4 mg via INTRAVENOUS
  Filled 2022-07-20: qty 2

## 2022-07-20 MED ORDER — HYDROCODONE-ACETAMINOPHEN 5-325 MG PO TABS: 1.0000 | ORAL_TABLET | Freq: Four times a day (QID) | ORAL | 0 refills | Status: DC | PRN

## 2022-07-20 MED ORDER — SODIUM CHLORIDE 0.9 % IV BOLUS
1000.0000 mL | Freq: Once | INTRAVENOUS | Status: AC
  Administered 2022-07-20: 1000 mL via INTRAVENOUS

## 2022-07-20 NOTE — ED Provider Notes (Signed)
[facility] EMERGENCY DEPARTMENT Provider Note   CSN: [id number] Arrival date & time: 07/20/22  1747     History {Add pertinent medical, surgical, social history, OB history to HPI:1} Chief Complaint  Patient presents with   Abdominal Pain    Lisa Bond is a 31 y.o. female.  Patient presents to the emergency department for ongoing pelvic pain, similar symptoms to what she was seen for 2 days ago.  Patient endorses recent pregnancy.  [facility]  She was seen in the emergency department 2 days ago for pain.  She had an ultrasound which was concerning for retained products of conception.  She was given Percocet which has not been helping and she is still uncomfortable.  She was given misoprostol which she has taken.  She does not feel as though she has had significant vaginal discharge or bleeding since that time.  Pain is cramping in nature but also constant.  No urinary symptoms.  She denies fever.  She recalls that [facility] she had very severe pain.  Current pain has been present since the procedure.  She states that she was called today to establish follow-up appointment with OB/GYN.  Patient states that she decided she is going to follow-up with [facility] because they are the one that did procedure.  However, she does not have an appointment with them until 07/26/2022.       Home Medications Prior to Admission medications   Medication Sig Start Date End Date Taking? Authorizing Provider  acetaminophen (TYLENOL) 500 MG tablet Take 2 tablets (1,000 mg total) by mouth every 6 (six) hours as needed for mild pain or moderate pain. Patient not taking: Reported on 03/08/2017 08/08/16   Katrinka BlazingSmith, [place], CNM  albuterol (PROVENTIL HFA) 108 [phone](90 Base) MCG/ACT inhaler Inhale 1-2 puffs into the lungs every 6 (six) hours as needed for shortness of breath.     [provider]  benzocaine (ORAJEL)  10 % mucosal gel Use as directed 1 application in the mouth or throat as needed for mouth pain. 10/22/17   Dartha LodgeFord, Kelsey N, PA-C  clotrimazole (LOTRIMIN) 1 % cream Apply to affected area 2 times daily until resolution. 03/05/19   Alvira MondaySchlossman, Erin, MD  cyclobenzaprine (FLEXERIL) 10 MG tablet Take 1 tablet (10 mg total) by mouth 2 (two) times daily as needed for muscle spasms. 04/19/22   Al DecantGroce, Christopher F, PA-C  fluticasone-salmeterol (ADVAIR HFA) 230-21 MCG/ACT inhaler Inhale 2 puffs into the lungs daily as needed (SOB).     [provider]  ibuprofen (ADVIL,MOTRIN) 600 MG tablet Take 1 tablet (600 mg total) by mouth every 6 (six) hours as needed. 10/22/17   Dartha LodgeFord, Kelsey N, PA-C  ibuprofen (ADVIL,MOTRIN) 800 MG tablet Take 1 tablet (800 mg total) by mouth 3 (three) times daily with meals. 09/08/17   Caccavale, Sophia, PA-C  naproxen (NAPROSYN) 500 MG tablet Take 1 tablet (500 mg total) by mouth 2 (two) times daily. 02/26/22   Smoot, Sarah A, PA-C  ondansetron (ZOFRAN ODT) 4 MG disintegrating tablet Take 1 tablet (4 mg total) by mouth every 8 (eight) hours as needed for nausea or vomiting. 01/13/21   Little, Ambrose [place], MD  oxyCODONE-acetaminophen (PERCOCET/ROXICET) 5-325 MG tablet Take 1 tablet by mouth every 8 (eight) hours as needed for severe pain. 07/18/22   Smoot, Sarah A, PA-C  phenol (CHLORASEPTIC) 1.4 % LIQD Use as directed 1 spray in the mouth or throat as  needed for throat irritation / pain. 09/08/17   Caccavale, Sophia, PA-C  Prenat w/o A Vit-FeFum-FePo-FA (CONCEPT OB) 130-92.4-1 MG CAPS Take 1 tablet by mouth daily. 08/08/16   Tamala Julian, [place], CNM  ranitidine (ZANTAC) 150 MG tablet Take 150 mg by mouth 2 (two) times daily.    [provider]      Allergies    Amoxicillin and Penicillins    Review of Systems   Review of Systems  Physical Exam Updated Vital Signs BP 111/73 (BP Location: Left Arm)   Pulse 80   Temp 98.3 F (36.8 C) (Oral)   Resp 18   Ht 5\' 2"  (1.575  m)   Wt 81.6 kg   LMP 07/15/2022   SpO2 98%   BMI 32.92 kg/m   Physical Exam Vitals and nursing note reviewed.  Constitutional:      General: She is not in acute distress.    Appearance: She is well-developed.  HENT:     Head: Normocephalic and atraumatic.     Right Ear: External ear normal.     Left Ear: External ear normal.     Nose: Nose normal.  Eyes:     Conjunctiva/sclera: Conjunctivae normal.  Cardiovascular:     Rate and Rhythm: Normal rate and regular rhythm.     Heart sounds: No murmur heard. Pulmonary:     Effort: No respiratory distress.     Breath sounds: No wheezing, rhonchi or rales.  Abdominal:     Palpations: Abdomen is soft.     Tenderness: There is abdominal tenderness. There is no guarding or rebound.     Comments: Patient with left lower abdominal and pelvic tenderness to palpation.  Musculoskeletal:     Cervical back: Normal range of motion and neck supple.     Right lower leg: No edema.     Left lower leg: No edema.  Skin:    General: Skin is warm and dry.     Findings: No rash.  Neurological:     General: No focal deficit present.     Mental Status: She is alert. Mental status is at baseline.     Motor: No weakness.  Psychiatric:        Mood and Affect: Mood normal.     ED Results / Procedures / Treatments   Labs (all labs ordered are listed, but only abnormal results are displayed) Labs Reviewed  CBC WITH DIFFERENTIAL/PLATELET - Abnormal; Notable for the following components:      Result Value   HCT 35.9 (*)    All other components within normal limits  BASIC METABOLIC PANEL  HCG, QUANTITATIVE, PREGNANCY  URINALYSIS, ROUTINE W REFLEX MICROSCOPIC    EKG None  Radiology No results found.  Procedures Procedures  {Document cardiac monitor, telemetry assessment procedure when appropriate:1}  Medications Ordered in ED Medications  HYDROmorphone (DILAUDID) injection 0.5 mg (0.5 mg Intravenous Given 07/20/22 2120)  ondansetron  (ZOFRAN) injection 4 mg (4 mg Intravenous Given 07/20/22 2118)    ED Course/ Medical Decision Making/ A&P    Patient seen and examined. History obtained directly from patient.  Reviewed previous lab work-up and imaging performed at previous ED visit.  Labs/EKG: Ordered CBC, BMP, quant hCG, UA.  Imaging: None ordered  Medications/Fluids: Ordered: IV Dilaudid and IV Zofran for pain  Most recent vital signs reviewed and are as follows: BP 111/73 (BP Location: Left Arm)   Pulse 80   Temp 98.3 F (36.8 C) (Oral)   Resp 18  Ht 5\' 2"  (1.575 m)   Wt 81.6 kg   LMP 07/15/2022   SpO2 98%   BMI 32.92 kg/m   Initial impression: Pelvic pain status post elective abortion.  Patient does not appear to be septic on exam and has not had any fevers.  No tachycardia or hypotension.                            Medical Decision Making Amount and/or Complexity of Data Reviewed Labs: ordered.  Risk Prescription drug management.   ***  {Document critical care time when appropriate:1} {Document review of labs and clinical decision tools ie heart score, Chads2Vasc2 etc:1}  {Document your independent review of radiology images, and any outside records:1} {Document your discussion with family members, caretakers, and with consultants:1} {Document social determinants of health affecting pt's care:1} {Document your decision making why or why not admission, treatments were needed:1} Final Clinical Impression(s) / ED Diagnoses Final diagnoses:  None    Rx / DC Orders ED Discharge Orders     None

## 2022-07-20 NOTE — ED Triage Notes (Signed)
Reported was seen 2 days ago for same issue, recently had a D&C; c/o ongoing pain and swelling on c-section site from 2018; reports was prescribed some pain and it has not helped.

## 2022-07-20 NOTE — ED Notes (Signed)
Pt had D & C on 07/13/22 Pt describes abd pain more intense than period cramping Denies N/V

## 2022-07-20 NOTE — Discharge Instructions (Signed)
Please read and follow all provided instructions.  Your diagnoses today include:  1. Pelvic pain     Tests performed today include: Blood cell counts and platelets: Your white blood cell is stable and is not elevated from previous visit Kidney and liver function tests: Slightly weak kidney function treated with IV fluids Pregnancy hormone continues to get lower Vital signs. See below for your results today.   Medications prescribed:  Vicodin (hydrocodone/acetaminophen) - narcotic pain medication  DO NOT drive or perform any activities that require you to be awake and alert because this medicine can make you drowsy. BE VERY CAREFUL not to take multiple medicines containing Tylenol (also called acetaminophen). Doing so can lead to an overdose which can damage your liver and cause liver failure and possibly death.  Meloxicam - anti-inflammatory pain medication  You have been prescribed an anti-inflammatory medication or NSAID. Take with food. Do not take aspirin, ibuprofen, or naproxen if taking this medication. Take smallest effective dose for the shortest duration needed for your pain. Stop taking if you experience stomach pain or vomiting.   Take any prescribed medications only as directed.  Home care instructions:  Follow any educational materials contained in this packet. You may use ice or heat on the areas that are sore.  Follow-up instructions: Please follow-up with your OB/GYN as planned.   Return instructions:  SEEK IMMEDIATE MEDICAL ATTENTION IF: The pain does not go away or becomes severe  A temperature above 101F develops  Repeated vomiting occurs (multiple episodes)  If you develop heavy vaginal bleeding or severe pelvic or back pain If you develop purulent or thick discharge from the vagina If you have any other emergent concerns regarding your health  Additional Information: Abdominal (belly) pain can be caused by many things. Your caregiver performed an  examination and possibly ordered blood/urine tests and imaging (CT scan, x-rays, ultrasound). Many cases can be observed and treated at home after initial evaluation in the emergency department. Even though you are being discharged home, abdominal pain can be unpredictable. Therefore, you need a repeated exam if your pain does not resolve, returns, or worsens. Most patients with abdominal pain don't have to be admitted to the hospital or have surgery, but serious problems like appendicitis and gallbladder attacks can start out as nonspecific pain. Many abdominal conditions cannot be diagnosed in one visit, so follow-up evaluations are very important.  Your vital signs today were: BP 119/74   Pulse 65   Temp 98.3 F (36.8 C) (Oral)   Resp 16   Ht 5\' 2"  (1.575 m)   Wt 81.6 kg   LMP 07/15/2022   SpO2 98%   BMI 32.92 kg/m  If your blood pressure (bp) was elevated above 135/85 this visit, please have this repeated by your doctor within one month. --------------

## 2022-07-20 NOTE — Telephone Encounter (Signed)
Patient was called to set up a ed follow up, there was no answer to the phone call so a voicemail was left and a letter was mailed.

## 2022-07-27 ENCOUNTER — Encounter: Payer: Medicaid Other | Admitting: Family Medicine

## 2022-11-23 ENCOUNTER — Emergency Department (HOSPITAL_BASED_OUTPATIENT_CLINIC_OR_DEPARTMENT_OTHER): Payer: Medicaid Other

## 2022-11-23 ENCOUNTER — Other Ambulatory Visit: Payer: Self-pay

## 2022-11-23 ENCOUNTER — Emergency Department (HOSPITAL_BASED_OUTPATIENT_CLINIC_OR_DEPARTMENT_OTHER)
Admission: EM | Admit: 2022-11-23 | Discharge: 2022-11-23 | Disposition: A | Discharge status: Home / Self Care | Payer: Medicaid Other | Attending: Emergency Medicine | Admitting: Emergency Medicine

## 2022-11-23 ENCOUNTER — Encounter (HOSPITAL_BASED_OUTPATIENT_CLINIC_OR_DEPARTMENT_OTHER): Payer: Self-pay

## 2022-11-23 DIAGNOSIS — Z6740 Type O blood, Rh positive: Secondary | ICD-10-CM | POA: Diagnosis not present

## 2022-11-23 DIAGNOSIS — Z3A01 Less than 8 weeks gestation of pregnancy: Secondary | ICD-10-CM | POA: Insufficient documentation

## 2022-11-23 DIAGNOSIS — O209 Hemorrhage in early pregnancy, unspecified: Secondary | ICD-10-CM | POA: Diagnosis present

## 2022-11-23 DIAGNOSIS — R7981 Abnormal blood-gas level: Secondary | ICD-10-CM | POA: Diagnosis not present

## 2022-11-23 DIAGNOSIS — N939 Abnormal uterine and vaginal bleeding, unspecified: Secondary | ICD-10-CM

## 2022-11-23 DIAGNOSIS — R102 Pelvic and perineal pain: Secondary | ICD-10-CM | POA: Diagnosis not present

## 2022-11-23 LAB — CBC WITH DIFFERENTIAL/PLATELET
Abs Immature Granulocytes: 0.01 10*3/uL (ref 0.00–0.07)
Basophils Absolute: 0.1 10*3/uL (ref 0.0–0.1)
Basophils Relative: 1 %
Eosinophils Absolute: 0.2 10*3/uL (ref 0.0–0.5)
Eosinophils Relative: 3 %
HCT: 37.5 % (ref 36.0–46.0)
Hemoglobin: 12.8 g/dL (ref 12.0–15.0)
Immature Granulocytes: 0 %
Lymphocytes Relative: 35 %
Lymphs Abs: 2.7 10*3/uL (ref 0.7–4.0)
MCH: 29.9 pg (ref 26.0–34.0)
MCHC: 34.1 g/dL (ref 30.0–36.0)
MCV: 87.6 fL (ref 80.0–100.0)
Monocytes Absolute: 0.6 10*3/uL (ref 0.1–1.0)
Monocytes Relative: 8 %
Neutro Abs: 4.1 10*3/uL (ref 1.7–7.7)
Neutrophils Relative %: 53 %
Platelets: 221 10*3/uL (ref 150–400)
RBC: 4.28 MIL/uL (ref 3.87–5.11)
RDW: 13.2 % (ref 11.5–15.5)
WBC: 7.7 10*3/uL (ref 4.0–10.5)
nRBC: 0 % (ref 0.0–0.2)

## 2022-11-23 LAB — BASIC METABOLIC PANEL
Anion gap: 5 (ref 5–15)
BUN: 15 mg/dL (ref 6–20)
CO2: 21 mmol/L — ABNORMAL LOW (ref 22–32)
Calcium: 8.3 mg/dL — ABNORMAL LOW (ref 8.9–10.3)
Chloride: 108 mmol/L (ref 98–111)
Creatinine, Ser: 0.62 mg/dL (ref 0.44–1.00)
GFR, Estimated: >60 mL/min (ref >60)
Glucose, Bld: 91 mg/dL (ref 70–99)
Potassium: 3.6 mmol/L (ref 3.5–5.1)
Sodium: 134 mmol/L — ABNORMAL LOW (ref 135–145)

## 2022-11-23 LAB — URINALYSIS, ROUTINE W REFLEX MICROSCOPIC
Bilirubin Urine: NEGATIVE
Glucose, UA: NEGATIVE mg/dL
Ketones, ur: NEGATIVE mg/dL
Leukocytes,Ua: NEGATIVE
Nitrite: NEGATIVE
Protein, ur: NEGATIVE mg/dL
Specific Gravity, Urine: >=1.03 (ref 1.005–1.030)
pH: 5.5 (ref 5.0–8.0)

## 2022-11-23 LAB — URINALYSIS, MICROSCOPIC (REFLEX)

## 2022-11-23 LAB — HCG, QUANTITATIVE, PREGNANCY: hCG, Beta Chain, Quant, S: 5528 m[IU]/mL — ABNORMAL HIGH (ref <5)

## 2022-11-23 MED ORDER — OXYCODONE-ACETAMINOPHEN 5-325 MG PO TABS: 1.0000 | ORAL_TABLET | Freq: Four times a day (QID) | ORAL | 0 refills | Status: DC | PRN

## 2022-11-23 MED ORDER — KETOROLAC TROMETHAMINE 60 MG/2ML IM SOLN
60.0000 mg | Freq: Once | INTRAMUSCULAR | Status: AC
  Administered 2022-11-23: 60 mg via INTRAMUSCULAR
  Filled 2022-11-23: qty 2

## 2022-11-23 NOTE — ED Notes (Signed)
Went in room to draw labs  Pt not in room at this time

## 2022-11-23 NOTE — ED Provider Notes (Signed)
[facility] Provider Note   CSN: [id number] Arrival date & time: 11/23/22  1916     History {Add pertinent medical, surgical, social history, OB history to HPI:1} No chief complaint on file.   Lisa Bond is a 32 y.o. female.  She is a G3 P1 who is [redacted] weeks pregnant and went to [facility].  Friday she said she was given a tablet in the office and then went home and took 4 tablets intravaginally which she thinks was misoprostol.  Early Saturday morning she had a lot of cramps and vaginal bleeding.  Yesterday she said she felt much better but then again today began experiencing more cramps pain in her back and more vaginal bleeding.  No fevers or chills.  She had 1 prior surgical AB that was complicated by retained products.  The history is provided by the patient.  Vaginal Bleeding Quality:  Dark red Severity:  Moderate Onset quality:  Gradual Duration:  1 day Timing:  Intermittent Progression:  Unchanged Chronicity:  Recurrent Relieved by:  None tried Worsened by:  Nothing Ineffective treatments:  None tried Associated symptoms: abdominal pain and back pain   Associated symptoms: no dizziness and no fever   Risk factors: prior miscarriage and terminated pregnancy        Home Medications Prior to Admission medications   Medication Sig Start Date End Date Taking? Authorizing Provider  acetaminophen (TYLENOL) 500 MG tablet Take 2 tablets (1,000 mg total) by mouth every 6 (six) hours as needed for mild pain or moderate pain. Patient not taking: Reported on 03/08/2017 08/08/16   Tamala Julian [place], CNM  albuterol (PROVENTIL HFA) [phone] Base) MCG/ACT inhaler Inhale 1-2 puffs into the lungs every 6 (six) hours as needed for shortness of breath.     [provider]  benzocaine (ORAJEL) 10 % mucosal gel Use as directed 1 application in the mouth or throat as needed for mouth pain. 10/22/17   Jacqlyn Larsen, PA-C   clotrimazole (LOTRIMIN) 1 % cream Apply to affected area 2 times daily until resolution. 03/05/19   Gareth Morgan, MD  cyclobenzaprine (FLEXERIL) 10 MG tablet Take 1 tablet (10 mg total) by mouth 2 (two) times daily as needed for muscle spasms. 04/19/22   Azucena Cecil, PA-C  fluticasone-salmeterol (ADVAIR HFA) 230-21 MCG/ACT inhaler Inhale 2 puffs into the lungs daily as needed (SOB).     [provider]  HYDROcodone-acetaminophen (NORCO/VICODIN) 5-325 MG tablet Take 1 tablet by mouth every 6 (six) hours as needed for severe pain. 07/20/22   Carlisle Cater, PA-C  ibuprofen (ADVIL,MOTRIN) 600 MG tablet Take 1 tablet (600 mg total) by mouth every 6 (six) hours as needed. 10/22/17   Jacqlyn Larsen, PA-C  meloxicam (MOBIC) 7.5 MG tablet Take 1 tablet (7.5 mg total) by mouth daily. 07/20/22   Carlisle Cater, PA-C  ondansetron (ZOFRAN ODT) 4 MG disintegrating tablet Take 1 tablet (4 mg total) by mouth every 8 (eight) hours as needed for nausea or vomiting. 01/13/21   Little, Wenda Overland, MD  phenol (CHLORASEPTIC) 1.4 % LIQD Use as directed 1 spray in the mouth or throat as needed for throat irritation / pain. 09/08/17   Caccavale, Sophia, PA-C  Prenat w/o A Vit-FeFum-FePo-FA (CONCEPT OB) 130-92.4-1 MG CAPS Take 1 tablet by mouth daily. 08/08/16   Tamala Julian, [place], CNM  ranitidine (ZANTAC) 150 MG tablet Take 150 mg by mouth 2 (two) times daily.    [provider]  Allergies    Amoxicillin and Penicillins    Review of Systems   Review of Systems  Constitutional:  Negative for fever.  Gastrointestinal:  Positive for abdominal pain.  Genitourinary:  Positive for vaginal bleeding.  Musculoskeletal:  Positive for back pain.  Neurological:  Negative for dizziness.    Physical Exam Updated Vital Signs BP 130/89 (BP Location: Right Arm)   Pulse 83   Temp 98.2 F (36.8 C) (Oral)   Resp 16   Ht '5\' 2"'$  (1.575 m)   Wt 77.1 kg   LMP 09/19/2022 (Exact Date)   SpO2 100%    BMI 31.09 kg/m  Physical Exam Vitals and nursing note reviewed.  Constitutional:      General: She is not in acute distress.    Appearance: Normal appearance. She is well-developed.  HENT:     Head: Normocephalic and atraumatic.  Eyes:     Conjunctiva/sclera: Conjunctivae normal.  Cardiovascular:     Rate and Rhythm: Normal rate and regular rhythm.     Heart sounds: No murmur heard. Pulmonary:     Effort: Pulmonary effort is normal. No respiratory distress.     Breath sounds: Normal breath sounds.  Abdominal:     Palpations: Abdomen is soft.     Tenderness: There is no abdominal tenderness. There is no guarding or rebound.  Genitourinary:    Comments: Pelvic exam done with Misty as chaperone.  She had a small amount of some dark blood in the vault.  Diffuse uterine tenderness. Musculoskeletal:        General: No deformity.     Cervical back: Neck supple.  Skin:    General: Skin is warm and dry.     Capillary Refill: Capillary refill takes less than 2 seconds.  Neurological:     General: No focal deficit present.     Mental Status: She is alert.     ED Results / Procedures / Treatments   Labs (all labs ordered are listed, but only abnormal results are displayed) Labs Reviewed  BASIC METABOLIC PANEL  CBC WITH DIFFERENTIAL/PLATELET  URINALYSIS, ROUTINE W REFLEX MICROSCOPIC  HCG, QUANTITATIVE, PREGNANCY    EKG None  Radiology No results found.  Procedures Procedures  {Document cardiac monitor, telemetry assessment procedure when appropriate:1}  Medications Ordered in ED Medications - No data to display  ED Course/ Medical Decision Making/ A&P   {   Click here for ABCD2, HEART and other calculatorsREFRESH Note before signing :1}                          Medical Decision Making Amount and/or Complexity of Data Reviewed Labs: ordered. Radiology: ordered.  Risk Prescription drug management.   This patient complains of ***; this involves an extensive  number of treatment Options and is a complaint that carries with it a high risk of complications and morbidity. The differential includes ***  I ordered, reviewed and interpreted labs, which included *** I ordered medication *** and reviewed PMP when indicated. I ordered imaging studies which included *** and I independently    visualized and interpreted imaging which showed *** Additional history obtained from *** Previous records obtained and reviewed *** I consulted *** and discussed lab and imaging findings and discussed disposition.  Cardiac monitoring reviewed, *** Social determinants considered, *** Critical Interventions: ***  After the interventions stated above, I reevaluated the patient and found *** Admission and further testing considered, ***   {Document critical care  time when appropriate:1} {Document review of labs and clinical decision tools ie heart score, Chads2Vasc2 etc:1}  {Document your independent review of radiology images, and any outside records:1} {Document your discussion with family members, caretakers, and with consultants:1} {Document social determinants of health affecting pt's care:1} {Document your decision making why or why not admission, treatments were needed:1} Final Clinical Impression(s) / ED Diagnoses Final diagnoses:  None    Rx / DC Orders ED Discharge Orders     None

## 2022-11-23 NOTE — ED Notes (Signed)
Assisted Dr. Melina Copa with Pelvic exam.  Pt. Tolerated well.

## 2022-11-23 NOTE — ED Triage Notes (Signed)
Pt to ED by POV from home s/p elective abortion on Friday. Pt states she took a form of abortion pill. Pt endorses severe cramping and passing clots since yesterday. Pt was [redacted] weeks along. Arrives A+O, VSS, NADN

## 2022-11-23 NOTE — Discharge Instructions (Addendum)
You are seen in the emergency department for vaginal bleeding and pelvic pain after a medical abortion.  Your ultrasound did not show any retained products.  Your red blood cell count was stable.  You can continue ibuprofen as needed for pain.  We are prescribing you a short course of Percocet.  Please follow-up with your gynecologist for further evaluation.  Return to the emergency department if any worsening or concerning symptoms

## 2023-05-06 ENCOUNTER — Encounter (HOSPITAL_BASED_OUTPATIENT_CLINIC_OR_DEPARTMENT_OTHER): Payer: Self-pay | Admitting: Emergency Medicine

## 2023-05-06 ENCOUNTER — Other Ambulatory Visit: Payer: Self-pay

## 2023-05-06 ENCOUNTER — Emergency Department (HOSPITAL_BASED_OUTPATIENT_CLINIC_OR_DEPARTMENT_OTHER)
Admission: EM | Admit: 2023-05-06 | Discharge: 2023-05-06 | Disposition: A | Discharge status: Home / Self Care | Payer: Medicaid Other | Attending: Emergency Medicine | Admitting: Emergency Medicine

## 2023-05-06 DIAGNOSIS — L739 Follicular disorder, unspecified: Secondary | ICD-10-CM

## 2023-05-06 DIAGNOSIS — L731 Pseudofolliculitis barbae: Secondary | ICD-10-CM | POA: Diagnosis not present

## 2023-05-06 MED ORDER — CEPHALEXIN 250 MG PO CAPS
500.0000 mg | ORAL_CAPSULE | Freq: Once | ORAL | Status: AC
  Administered 2023-05-06: 500 mg via ORAL
  Filled 2023-05-06: qty 2

## 2023-05-06 MED ORDER — CEPHALEXIN 500 MG PO CAPS: 500.0000 mg | ORAL_CAPSULE | Freq: Four times a day (QID) | ORAL | 0 refills | Status: DC

## 2023-05-06 MED ORDER — IBUPROFEN 400 MG PO TABS
400.0000 mg | ORAL_TABLET | Freq: Once | ORAL | Status: AC
  Administered 2023-05-06: 400 mg via ORAL
  Filled 2023-05-06: qty 1

## 2023-05-06 NOTE — ED Triage Notes (Signed)
In grown hair in groin area , Hx abscess she said .

## 2023-05-06 NOTE — ED Provider Notes (Signed)
[facility] [facility] Provider Note   CSN: [id number] Arrival date & time: 05/06/23  4098     History  Chief Complaint  Patient presents with   Recurrent Skin Infections    groin    Lisa Bond is a 32 y.o. female.  Pt c/o ingrown hair right inguinal area. Had hair to area recently waxed/shaved. No other skin lesions. No fever or chills. No hx mrsa.   The history is provided by the patient.       Home Medications Prior to Admission medications   Medication Sig Start Date End Date Taking? Authorizing Provider  acetaminophen (TYLENOL) 500 MG tablet Take 2 tablets (1,000 mg total) by mouth every 6 (six) hours as needed for mild pain or moderate pain. Patient not taking: Reported on 03/08/2017 08/08/16   Katrinka Blazing [place], CNM  albuterol (PROVENTIL HFA) [phone] Base) MCG/ACT inhaler Inhale 1-2 puffs into the lungs every 6 (six) hours as needed for shortness of breath.     [provider]  benzocaine (ORAJEL) 10 % mucosal gel Use as directed 1 application in the mouth or throat as needed for mouth pain. 10/22/17   Dartha Lodge, PA-C  clotrimazole (LOTRIMIN) 1 % cream Apply to affected area 2 times daily until resolution. 03/05/19   Alvira Monday, MD  cyclobenzaprine (FLEXERIL) 10 MG tablet Take 1 tablet (10 mg total) by mouth 2 (two) times daily as needed for muscle spasms. 04/19/22   Al Decant, PA-C  fluticasone-salmeterol (ADVAIR HFA) 230-21 MCG/ACT inhaler Inhale 2 puffs into the lungs daily as needed (SOB).     [provider]  HYDROcodone-acetaminophen (NORCO/VICODIN) 5-325 MG tablet Take 1 tablet by mouth every 6 (six) hours as needed for severe pain. 07/20/22   Renne Crigler, PA-C  ibuprofen (ADVIL,MOTRIN) 600 MG tablet Take 1 tablet (600 mg total) by mouth every 6 (six) hours as needed. 10/22/17   Dartha Lodge, PA-C  meloxicam (MOBIC) 7.5 MG tablet Take 1 tablet (7.5 mg total) by mouth daily. 07/20/22   Renne Crigler, PA-C  ondansetron (ZOFRAN ODT) 4 MG disintegrating tablet Take 1 tablet (4 mg total) by mouth every 8 (eight) hours as needed for nausea or vomiting. 01/13/21   Little, Ambrose [place], MD  oxyCODONE-acetaminophen (PERCOCET/ROXICET) 5-325 MG tablet Take 1 tablet by mouth every 6 (six) hours as needed for severe pain. 11/23/22   Terrilee Files, MD  phenol (CHLORASEPTIC) 1.4 % LIQD Use as directed 1 spray in the mouth or throat as needed for throat irritation / pain. 09/08/17   Caccavale, Sophia, PA-C  Prenat w/o A Vit-FeFum-FePo-FA (CONCEPT OB) 130-92.4-1 MG CAPS Take 1 tablet by mouth daily. 08/08/16   Katrinka Blazing, [place], CNM  ranitidine (ZANTAC) 150 MG tablet Take 150 mg by mouth 2 (two) times daily.    [provider]      Allergies    Amoxicillin and Penicillins    Review of Systems   Review of Systems  Constitutional:  Negative for fever.  Gastrointestinal:  Negative for abdominal pain.  Skin:        No generalized rash    Physical Exam Updated Vital Signs BP 125/76 (BP Location: Right Arm)   Pulse 86   Temp 98.2 F (36.8 C) (Oral)   Resp 16   Wt 81.6 kg   LMP 04/21/2023   SpO2 96%   BMI 32.92 kg/m  Physical Exam Vitals and nursing note reviewed.  Constitutional:  Appearance: Normal appearance. She is well-developed.  HENT:     Head: Atraumatic.     Nose: Nose normal.  Neck:     Trachea: No tracheal deviation.  Pulmonary:     Effort: Pulmonary effort is normal. No respiratory distress.  Abdominal:     Palpations: Abdomen is soft.     Tenderness: There is no abdominal tenderness.  Musculoskeletal:        General: No swelling.     Cervical back: No muscular tenderness.     Comments: Exam c/w ingrown hair right inguinal area, with v small area erythema/induration. No drainable abscess noted.   Skin:    General: Skin is warm and dry.     Findings: No rash.  Neurological:     Mental Status: She is alert.     Comments: Alert, speech normal.    Psychiatric:        Mood and Affect: Mood normal.     ED Results / Procedures / Treatments   Labs (all labs ordered are listed, but only abnormal results are displayed) Labs Reviewed - No data to display  EKG None  Radiology No results found.  Procedures Procedures    Medications Ordered in ED Medications  ibuprofen (ADVIL) tablet 400 mg (has no administration in time range)  cephALEXin (KEFLEX) capsule 500 mg (has no administration in time range)    ED Course/ Medical Decision Making/ A&P                                 Medical Decision Making Problems Addressed: Folliculitis: acute illness or injury Ingrown hair: acute illness or injury  Amount and/or Complexity of Data Reviewed External Data Reviewed: notes.  Risk Prescription drug management.   Reviewed nursing notes and prior charts for additional history.   No meds pta.   Keflex po. Ibuprofen po. Warm compresses.   Pt currently appears stable for d/c. Discussed if increased swelling/redness, return as may need I and D if abscess develops.           Final Clinical Impression(s) / ED Diagnoses Final diagnoses:  None    Rx / DC Orders ED Discharge Orders     None         Cathren Laine, MD 05/06/23 1013

## 2023-05-06 NOTE — Discharge Instructions (Signed)
It was our pleasure to provide your ER care today - we hope that you feel better.  Keep area very clean. Warm compresses to area. Take acetaminophen or ibuprofen as need. Take keflex (antibiotic) as prescribed.   Follow up with primary care doctor in one week if symptoms fail to improve/resolve.  Return to ER if worse, new symptoms, fevers, spreading redness, increased swelling, severe pain, or other concern.

## 2024-04-10 ENCOUNTER — Emergency Department (HOSPITAL_BASED_OUTPATIENT_CLINIC_OR_DEPARTMENT_OTHER)
Admission: EM | Admit: 2024-04-10 | Discharge: 2024-04-10 | Disposition: A | Discharge status: Home / Self Care | Attending: Emergency Medicine | Admitting: Emergency Medicine

## 2024-04-10 ENCOUNTER — Encounter (HOSPITAL_BASED_OUTPATIENT_CLINIC_OR_DEPARTMENT_OTHER): Payer: Self-pay

## 2024-04-10 ENCOUNTER — Other Ambulatory Visit: Payer: Self-pay

## 2024-04-10 DIAGNOSIS — K047 Periapical abscess without sinus: Secondary | ICD-10-CM | POA: Diagnosis not present

## 2024-04-10 DIAGNOSIS — F17210 Nicotine dependence, cigarettes, uncomplicated: Secondary | ICD-10-CM | POA: Insufficient documentation

## 2024-04-10 DIAGNOSIS — J45909 Unspecified asthma, uncomplicated: Secondary | ICD-10-CM | POA: Diagnosis not present

## 2024-04-10 DIAGNOSIS — K0889 Other specified disorders of teeth and supporting structures: Secondary | ICD-10-CM | POA: Diagnosis present

## 2024-04-10 DIAGNOSIS — Z7951 Long term (current) use of inhaled steroids: Secondary | ICD-10-CM | POA: Diagnosis not present

## 2024-04-10 MED ORDER — CLINDAMYCIN HCL 300 MG PO CAPS: 300.0000 mg | ORAL_CAPSULE | Freq: Three times a day (TID) | ORAL | 0 refills | Status: AC

## 2024-04-10 MED ORDER — CHLORHEXIDINE GLUCONATE 0.12 % MT SOLN: 15.0000 mL | Freq: Two times a day (BID) | OROMUCOSAL | 0 refills | Status: DC

## 2024-04-10 MED ORDER — CELECOXIB 200 MG PO CAPS: 200.0000 mg | ORAL_CAPSULE | Freq: Two times a day (BID) | ORAL | 0 refills | Status: DC | PRN

## 2024-04-10 NOTE — ED Triage Notes (Signed)
 Pt reports dental pain and right-sided facial swelling 3 weeks. Reports a broken tooth on the right side and had dental appointment pushed back and just needs antibiotics.

## 2024-04-10 NOTE — ED Provider Notes (Signed)
 [name] Provider Note   CSN: [id number] Arrival date & time: 04/10/24  1133     Patient presents with: Dental Pain   Lisa Bond is a 33 y.o. female.    Dental Pain   33 year old female presents emergency department with right lower dental pain.  Has known fractured tooth in the area.  States that she has had some pain for the past couple of weeks with facial swelling worsening pain over the past couple days.  States that she does have an appointment within the next 2 weeks with a dental specialist have affected tooth addressed.  Denies any fevers, floor of mouth/tongue swelling, difficulty breathing, feelings of throat closing on her.  States that she has had symptoms like this before and got better with antibiotics.  Presents emergency department for further assessment/evaluation.  Past medical history significant for GERD, asthma, anal fistula  Prior to Admission medications   Medication Sig Start Date End Date Taking? Authorizing Provider  celecoxib  (CELEBREX ) 200 MG capsule Take 1 capsule (200 mg total) by mouth 2 (two) times daily as needed. 04/10/24  Yes Silver Fell A, PA  chlorhexidine  (PERIDEX ) 0.12 % solution Use as directed 15 mLs in the mouth or throat 2 (two) times daily. 04/10/24  Yes Silver Fell A, PA  clindamycin  (CLEOCIN ) 300 MG capsule Take 1 capsule (300 mg total) by mouth 3 (three) times daily for 10 days. 04/10/24 04/20/24 Yes Silver Fell A, PA  acetaminophen  (TYLENOL ) 500 MG tablet Take 2 tablets (1,000 mg total) by mouth every 6 (six) hours as needed for mild pain or moderate pain. Patient not taking: Reported on 03/08/2017 08/08/16   Lisa, Bond , CNM  albuterol  (PROVENTIL  HFA) 108 (90 Base) MCG/ACT inhaler Inhale 1-2 puffs into the lungs every 6 (six) hours as needed for shortness of breath.     [provider]  benzocaine  (ORAJEL) 10 % mucosal gel Use as directed 1 application in the mouth  or throat as needed for mouth pain. 10/22/17   Alva Larraine FALCON, PA-C  cephALEXin  (KEFLEX ) 500 MG capsule Take 1 capsule (500 mg total) by mouth 4 (four) times daily. 05/06/23   Bernard Drivers, MD  clotrimazole  (LOTRIMIN ) 1 % cream Apply to affected area 2 times daily until resolution. 03/05/19   Dreama Longs, MD  cyclobenzaprine  (FLEXERIL ) 10 MG tablet Take 1 tablet (10 mg total) by mouth 2 (two) times daily as needed for muscle spasms. 04/19/22   Ruthell Lonni FALCON, PA-C  fluticasone-salmeterol (ADVAIR HFA) 230-21 MCG/ACT inhaler Inhale 2 puffs into the lungs daily as needed (SOB).     [provider]  HYDROcodone -acetaminophen  (NORCO/VICODIN) 5-325 MG tablet Take 1 tablet by mouth every 6 (six) hours as needed for severe pain. 07/20/22   Geiple, Joshua, PA-C  ibuprofen  (ADVIL ,MOTRIN ) 600 MG tablet Take 1 tablet (600 mg total) by mouth every 6 (six) hours as needed. 10/22/17   Alva Larraine FALCON, PA-C  meloxicam  (MOBIC ) 7.5 MG tablet Take 1 tablet (7.5 mg total) by mouth daily. 07/20/22   Geiple, Joshua, PA-C  ondansetron  (ZOFRAN  ODT) 4 MG disintegrating tablet Take 1 tablet (4 mg total) by mouth every 8 (eight) hours as needed for nausea or vomiting. 01/13/21   Little, Vernell Search, MD  oxyCODONE -acetaminophen  (PERCOCET/ROXICET) 5-325 MG tablet Take 1 tablet by mouth every 6 (six) hours as needed for severe pain. 11/23/22   Butler, Michael C, MD  phenol (CHLORASEPTIC) 1.4 % LIQD Use as directed 1 spray  in the mouth or throat as needed for throat irritation / pain. 09/08/17   Caccavale, Sophia, PA-C  Prenat w/o A Vit-FeFum-FePo-FA (CONCEPT OB ) 130-92.4-1 MG CAPS Take 1 tablet by mouth daily. 08/08/16   Smith, Bond , CNM  ranitidine (ZANTAC) 150 MG tablet Take 150 mg by mouth 2 (two) times daily.    [provider]    Allergies: Amoxicillin and Penicillins    Review of Systems  All other systems reviewed and are negative.   Updated Vital Signs BP (!) 129/96   Pulse 66   Temp  97.8 F (36.6 C)   Resp 16   SpO2 100%   Physical Exam Vitals and nursing note reviewed.  Constitutional:      General: She is not in acute distress.    Appearance: She is well-developed.  HENT:     Head: Normocephalic and atraumatic.     Mouth/Throat:      Comments: Fractured tooth as above with gingival swelling.  Periapical abscess present as well with drainage upon palpation.  No sublingual extremities or swelling.  No trismus.  No change in phonation.  Uvula midline rises symmetrically phonation. Eyes:     Conjunctiva/sclera: Conjunctivae normal.  Cardiovascular:     Rate and Rhythm: Normal rate and regular rhythm.     Heart sounds: No murmur heard. Pulmonary:     Effort: Pulmonary effort is normal. No respiratory distress.     Breath sounds: Normal breath sounds. No wheezing, rhonchi or rales.  Abdominal:     Palpations: Abdomen is soft.     Tenderness: There is no abdominal tenderness.  Musculoskeletal:        General: No swelling.     Cervical back: Neck supple.  Skin:    General: Skin is warm and dry.     Capillary Refill: Capillary refill takes less than 2 seconds.  Neurological:     Mental Status: She is alert.  Psychiatric:        Mood and Affect: Mood normal.     (all labs ordered are listed, but only abnormal results are displayed) Labs Reviewed - No data to display  EKG: None  Radiology: No results found.   .Incision and Drainage  Date/Time: 04/10/2024 11:54 AM  Performed by: Silver Wonda LABOR, PA Authorized by: Silver Wonda LABOR, PA   Consent:    Consent obtained:  Verbal   Consent given by:  Patient   Risks discussed:  Bleeding, incomplete drainage, pain and damage to other organs   Alternatives discussed:  No treatment Universal protocol:    Procedure explained and questions answered to patient or proxy's satisfaction: yes     Relevant documents present and verified: yes     Test results available : yes     Patient identity confirmed:   Verbally with patient Location:    Type:  Abscess   Size:  1.0   Location:  Mouth   Mouth location: Periapical abscess. Sedation:    Sedation type:  None Anesthesia:    Anesthesia method:  None Procedure type:    Complexity:  Simple Procedure details:    Ultrasound guidance: no     Needle aspiration: no     Wound management:  Irrigated with saline   Drainage:  Purulent   Drainage amount:  Moderate   Wound treatment:  Wound left open Post-procedure details:    Procedure completion:  Tolerated well, no immediate complications Comments:     Area began actively draining with palpation.  Series  of manipulations performed with palpation moving towards area of draining abscess with moderate amounts of purulent drainage appreciated.  Area was was irrigated with normal saline.    Medications Ordered in the ED - No data to display                                  Medical Decision Making Risk Prescription drug management.   This patient presents to the ED for concern of dental pain, this involves an extensive number of treatment options, and is a complaint that carries with it a high risk of complications and morbidity.  The differential diagnosis includes dental carry, fractured tooth, periapical abscess, peritonsillar abscess, necrotizing ulcerative gingivitis, other   Co morbidities that complicate the patient evaluation  See HPI   Additional history obtained:  Additional history obtained from EMR External records from outside source obtained and reviewed including hospital records   Lab Tests:  N/a   Imaging Studies ordered:  N/a   Cardiac Monitoring: / EKG:  N/a   Consultations Obtained:  N/a   Problem List / ED Course / Critical interventions / Medication management  Dental pain Reevaluation of the patient showed that the patient stayed the same I have reviewed the patients home medicines and have made adjustments as needed   Social Determinants  of Health:  Cigarette/cigar use.  Denies illicit drug use.   Test / Admission - Considered:  Dental pain, periapical abscess Vitals signs within normal range and stable throughout visit. 33 year old female presents emergency department with right lower dental pain.  Has known fractured tooth in the area.  States that she has had some pain for the past couple of weeks with facial swelling worsening pain over the past couple days.  States that she does have an appointment within the next 2 weeks with a dental specialist have affected tooth addressed.  Denies any fevers, floor of mouth/tongue swelling, difficulty breathing, feelings of throat closing on her.  States that she has had symptoms like this before and got better with antibiotics.  Presents emergency department for further assessment/evaluation. On exam, fractured molar as above with gingival swelling evidence of periapical abscess.  No evidence clinically of Ludwig angina.  Abscess drained in the area as above with significant improvement of symptoms thereafter.  Will empirically place patient on antibiotics as well as recommend proper hygiene at home.  Patient already has follow-up with dental specialist in the outpatient setting.  Strict return/ED precautions discussed.  Treatment plan discussed with patient and she acknowledged understanding was agreeable to said plan.  Patient overall well-appearing, afebrile in no acute distress. Worrisome signs and symptoms were discussed with the patient, and the patient acknowledged understanding to return to the ED if noticed. Patient was stable upon discharge.       Final diagnoses:  Pain, dental  Periapical abscess    ED Discharge Orders          Ordered    clindamycin  (CLEOCIN ) 300 MG capsule  3 times daily        04/10/24 1150    celecoxib  (CELEBREX ) 200 MG capsule  2 times daily PRN        04/10/24 1150    chlorhexidine  (PERIDEX ) 0.12 % solution  2 times daily        04/10/24 1150                Silver Wonda LABOR, [place] 04/10/24  1157    Pamella Ozell LABOR, DO 04/17/24 1530

## 2024-04-10 NOTE — Discharge Instructions (Addendum)
 As discussed, we will try an antibiotics for treatment of presumed dental infection.  Continue to use medications as discussed for treatment of pain.  Will also send you home with a antibacterial mouthwash to use.  Recommend follow-up with dentistry in the outpatient setting to have affected teeth addressed.  Please not hesitate to return to emergency department if the worrisome signs and symptoms we discussed become apparent.

## 2024-04-10 NOTE — ED Notes (Signed)
 Discharge instructions reviewed with patient. Patient verbalizes understanding, no further questions at this time. Medications/prescriptions and follow up information provided. No acute distress noted at time of departure.

## 2024-05-24 ENCOUNTER — Encounter (HOSPITAL_BASED_OUTPATIENT_CLINIC_OR_DEPARTMENT_OTHER): Payer: Self-pay

## 2024-05-24 ENCOUNTER — Emergency Department (HOSPITAL_BASED_OUTPATIENT_CLINIC_OR_DEPARTMENT_OTHER)
Admission: EM | Admit: 2024-05-24 | Discharge: 2024-05-24 | Disposition: A | Discharge status: Home / Self Care | Attending: Emergency Medicine | Admitting: Emergency Medicine

## 2024-05-24 ENCOUNTER — Other Ambulatory Visit: Payer: Self-pay

## 2024-05-24 DIAGNOSIS — B349 Viral infection, unspecified: Secondary | ICD-10-CM | POA: Insufficient documentation

## 2024-05-24 DIAGNOSIS — R070 Pain in throat: Secondary | ICD-10-CM | POA: Insufficient documentation

## 2024-05-24 DIAGNOSIS — R059 Cough, unspecified: Secondary | ICD-10-CM | POA: Diagnosis present

## 2024-05-24 LAB — RESP PANEL BY RT-PCR (RSV, FLU A&B, COVID)  RVPGX2
Influenza A by PCR: NEGATIVE
Influenza B by PCR: NEGATIVE
Resp Syncytial Virus by PCR: NEGATIVE
SARS Coronavirus 2 by RT PCR: NEGATIVE

## 2024-05-24 LAB — GROUP A STREP BY PCR: Group A Strep by PCR: NOT DETECTED

## 2024-05-24 MED ORDER — DEXAMETHASONE SODIUM PHOSPHATE 10 MG/ML IJ SOLN
10.0000 mg | Freq: Once | INTRAMUSCULAR | Status: AC
  Administered 2024-05-24: 10 mg via INTRAMUSCULAR
  Filled 2024-05-24: qty 1

## 2024-05-24 MED ORDER — IBUPROFEN 600 MG PO TABS: 600.0000 mg | ORAL_TABLET | Freq: Three times a day (TID) | ORAL | 0 refills | Status: DC | PRN

## 2024-05-24 MED ORDER — PREDNISONE 10 MG PO TABS: 20.0000 mg | ORAL_TABLET | Freq: Every day | ORAL | 0 refills | Status: AC

## 2024-05-24 MED ORDER — KETOROLAC TROMETHAMINE 30 MG/ML IJ SOLN
30.0000 mg | Freq: Once | INTRAMUSCULAR | Status: AC
  Administered 2024-05-24: 30 mg via INTRAMUSCULAR
  Filled 2024-05-24: qty 1

## 2024-05-24 NOTE — Discharge Instructions (Addendum)
 Your strep, flu and COVID test were all negative.  You likely have another viral illness.  You can follow-up with your primary care clinic next week if you continue to feel ill.

## 2024-05-24 NOTE — ED Provider Notes (Signed)
  EMERGENCY DEPARTMENT AT [facility] Provider Note   CSN: [id number] Arrival date & time: 05/24/24  9242     Patient presents with: flu-like symptoms   Lisa Bond is a 33 y.o. female presenting to the ED with 1 to 2 weeks of constellation of symptoms.  Patient reports began of a sore throat maybe 2 weeks ago.  She says she has a sharp pain in her throat that is sometimes relieved if she pushes on it he thinks it is her cervical lymph node.  She separately reports that her ears feel like they have gotten stopped up.  She has had a cough.  She did have an intermittent headache.  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  She is not diabetic.  No sick contacts in the house   HPI     Prior to Admission medications   Medication Sig Start Date End Date Taking? Authorizing Provider  ibuprofen  (ADVIL ) 600 MG tablet Take 1 tablet (600 mg total) by mouth every 8 (eight) hours as needed for up to 30 doses for mild pain (pain score 1-3) or moderate pain (pain score 4-6). 05/24/24  Yes Cottie Donnice PARAS, MD  predniSONE  (DELTASONE ) 10 MG tablet Take 2 tablets (20 mg total) by mouth daily for 4 days. 05/25/24 05/29/24 Yes Lilyth Lawyer, Donnice PARAS, MD  acetaminophen  (TYLENOL ) 500 MG tablet Take 2 tablets (1,000 mg total) by mouth every 6 (six) hours as needed for mild pain or moderate pain. Patient not taking: Reported on 03/08/2017 08/08/16   Claudene, [place] , CNM  albuterol  (PROVENTIL  HFA) 108 (90 Base) MCG/ACT inhaler Inhale 1-2 puffs into the lungs every 6 (six) hours as needed for shortness of breath.     [provider]  benzocaine  (ORAJEL) 10 % mucosal gel Use as directed 1 application in the mouth or throat as needed for mouth pain. 10/22/17   Alva Larraine FALCON, PA-C  celecoxib  (CELEBREX ) 200 MG capsule Take 1 capsule (200 mg total) by mouth 2 (two) times daily as needed. 04/10/24   Silver Wonda LABOR, PA   cephALEXin  (KEFLEX ) 500 MG capsule Take 1 capsule (500 mg total) by mouth 4 (four) times daily. 05/06/23   Steinl, Kevin, MD  chlorhexidine  (PERIDEX ) 0.12 % solution Use as directed 15 mLs in the mouth or throat 2 (two) times daily. 04/10/24   Silver Wonda LABOR, PA  clotrimazole  (LOTRIMIN ) 1 % cream Apply to affected area 2 times daily until resolution. 03/05/19   Dreama Longs, MD  cyclobenzaprine  (FLEXERIL ) 10 MG tablet Take 1 tablet (10 mg total) by mouth 2 (two) times daily as needed for muscle spasms. 04/19/22   Ruthell Lonni FALCON, PA-C  fluticasone-salmeterol (ADVAIR HFA) 230-21 MCG/ACT inhaler Inhale 2 puffs into the lungs daily as needed (SOB).     [provider]  HYDROcodone -acetaminophen  (NORCO/VICODIN) 5-325 MG tablet Take 1 tablet by mouth every 6 (six) hours as needed for severe pain. 07/20/22   Geiple, Joshua, PA-C  ibuprofen  (ADVIL ,MOTRIN ) 600 MG tablet Take 1 tablet (600 mg total) by mouth every 6 (six) hours as needed. 10/22/17   Alva Larraine FALCON, PA-C  meloxicam  (MOBIC ) 7.5 MG tablet Take 1 tablet (7.5 mg total) by mouth daily. 07/20/22   Desiderio Chew, PA-C  ondansetron  (ZOFRAN  ODT) 4 MG disintegrating tablet Take 1 tablet (4 mg total) by mouth every 8 (eight) hours as needed for nausea or vomiting. 01/13/21   Little,  Vernell Search, MD  oxyCODONE -acetaminophen  (PERCOCET/ROXICET) 5-325 MG tablet Take 1 tablet by mouth every 6 (six) hours as needed for severe pain. 11/23/22   Towana Ozell BROCKS, MD  phenol (CHLORASEPTIC) 1.4 % LIQD Use as directed 1 spray in the mouth or throat as needed for throat irritation / pain. 09/08/17   Caccavale, Sophia, PA-C  Prenat w/o A Vit-FeFum-FePo-FA (CONCEPT OB ) 130-92.4-1 MG CAPS Take 1 tablet by mouth daily. 08/08/16   Smith, Virginia , CNM  ranitidine (ZANTAC) 150 MG tablet Take 150 mg by mouth 2 (two) times daily.    [provider]    Allergies: Amoxicillin and Penicillins    Review of Systems  Updated Vital Signs BP 138/84    Pulse 70   Temp 98.3 F (36.8 C) (Oral)   Resp 18   Ht 5' 2 (1.575 m)   Wt 76.2 kg   LMP 05/17/2024   SpO2 100%   BMI 30.73 kg/m   Physical Exam Constitutional:      General: She is not in acute distress. HENT:     Head: Normocephalic and atraumatic.     Right Ear: Tympanic membrane and ear canal normal.     Left Ear: Tympanic membrane and ear canal normal.     Mouth/Throat:     Pharynx: Oropharynx is clear. Uvula midline. No pharyngeal swelling, oropharyngeal exudate, posterior oropharyngeal erythema, uvula swelling or postnasal drip.     Tonsils: No tonsillar exudate or tonsillar abscesses.     Comments: Cervical tender lymphadenopathy, symmetrical and shotty Eyes:     Conjunctiva/sclera: Conjunctivae normal.     Pupils: Pupils are equal, round, and reactive to light.  Cardiovascular:     Rate and Rhythm: Normal rate and regular rhythm.  Pulmonary:     Effort: Pulmonary effort is normal. No respiratory distress.  Skin:    General: Skin is warm and dry.  Neurological:     General: No focal deficit present.     Mental Status: She is alert. Mental status is at baseline.  Psychiatric:        Mood and Affect: Mood normal.        Behavior: Behavior normal.     (all labs ordered are listed, but only abnormal results are displayed) Labs Reviewed  GROUP A STREP BY PCR  RESP PANEL BY RT-PCR (RSV, FLU A&B, COVID)  [id number]    EKG: None  Radiology: No results found.   Procedures   Medications Ordered in the ED  dexamethasone  (DECADRON ) injection 10 mg (has no administration in time range)  ketorolac  (TORADOL ) 30 MG/ML injection 30 mg (has no administration in time range)                                    Medical Decision Making Risk Prescription drug management.   Patient is here with suspected viral syndrome.  She is now at least 1 week into her symptoms and I think no longer contagious.  I reviewed her workup in the ED and her COVID and flu test were  negative.  Strep test was negative and on clinical exam I do not see any evidence of acute bacterial throat infection, deep space infection, peritonsillar abscess.  No indication for blood testing or imaging at this time.  Patient appears comfortable and is tolerating her secretions.  We did discuss a dose of IM steroids and Toradol  to help with her suspected symptomatic cervical  lymphadenopathy.  Also discussed using antihistamines for the next week which may help with her ear pain, likely mild ear effusion in the setting of viral syndrome.  She is stable for discharge.     Final diagnoses:  Viral syndrome    ED Discharge Orders          Ordered    predniSONE  (DELTASONE ) 10 MG tablet  Daily        05/24/24 0914    ibuprofen  (ADVIL ) 600 MG tablet  Every 8 hours PRN        05/24/24 0914               Cottie Donnice PARAS, MD 05/24/24 [phone]

## 2024-05-24 NOTE — ED Triage Notes (Signed)
 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 Also reports flu-like symptoms. Pt also reports sore throat.

## 2024-06-30 ENCOUNTER — Emergency Department (HOSPITAL_BASED_OUTPATIENT_CLINIC_OR_DEPARTMENT_OTHER)
Admission: EM | Admit: 2024-06-30 | Discharge: 2024-06-30 | Disposition: A | Discharge status: Home / Self Care | Payer: Self-pay | Attending: Emergency Medicine | Admitting: Emergency Medicine

## 2024-06-30 ENCOUNTER — Encounter (HOSPITAL_BASED_OUTPATIENT_CLINIC_OR_DEPARTMENT_OTHER): Payer: Self-pay

## 2024-06-30 ENCOUNTER — Other Ambulatory Visit: Payer: Self-pay

## 2024-06-30 DIAGNOSIS — K0889 Other specified disorders of teeth and supporting structures: Secondary | ICD-10-CM | POA: Insufficient documentation

## 2024-06-30 MED ORDER — CLINDAMYCIN HCL 300 MG PO CAPS: 300.0000 mg | ORAL_CAPSULE | Freq: Three times a day (TID) | ORAL | 0 refills | Status: AC

## 2024-06-30 MED ORDER — CLINDAMYCIN HCL 150 MG PO CAPS
300.0000 mg | ORAL_CAPSULE | Freq: Once | ORAL | Status: AC
  Administered 2024-06-30: 300 mg via ORAL
  Filled 2024-06-30: qty 2

## 2024-06-30 MED ORDER — MELOXICAM 7.5 MG PO TABS: 7.5000 mg | ORAL_TABLET | Freq: Every day | ORAL | 0 refills | Status: AC

## 2024-06-30 MED ORDER — CHLORHEXIDINE GLUCONATE 0.12 % MT SOLN: 15.0000 mL | Freq: Two times a day (BID) | OROMUCOSAL | 0 refills | Status: AC

## 2024-06-30 MED ORDER — BENZOCAINE 10 % MT GEL: 1.0000 | OROMUCOSAL | 0 refills | Status: AC | PRN

## 2024-06-30 MED ORDER — KETOROLAC TROMETHAMINE 30 MG/ML IJ SOLN
30.0000 mg | Freq: Once | INTRAMUSCULAR | Status: AC
  Administered 2024-06-30: 30 mg via INTRAMUSCULAR
  Filled 2024-06-30: qty 1

## 2024-06-30 NOTE — ED Provider Notes (Signed)
 [place] [facility] AT [facility] Provider Note   CSN: [id number] Arrival date & time: 06/30/24  1633    Patient presents with: Dental Pain   Lisa Bond is a 33 y.o. female here for evaluation of dental pain.  Has had an ongoing problem with her right lower dentition.  Her tooth is cracked to the skin.  She was seen here previously her symptoms had improved she actually had follow-up appoint with dentistry however she lost her dental insurance and treatment is going to be thousand dollars.  No fever, facial swelling, drooling, dysphagia, trismus.  She has been doing Tylenol  ibuprofen  at home without relief.  She is supposed to go to work this evening however did not think she could work due to the pain and wanted something nonnarcotic here.   HPI     Prior to Admission medications   Medication Sig Start Date End Date Taking? Authorizing Provider  chlorhexidine  (PERIDEX ) 0.12 % solution Use as directed 15 mLs in the mouth or throat 2 (two) times daily. 06/30/24  Yes Celso Granja A, PA-C  clindamycin  (CLEOCIN ) 300 MG capsule Take 1 capsule (300 mg total) by mouth 3 (three) times daily for 5 days. 06/30/24 07/05/24 Yes Sricharan Lacomb A, PA-C  meloxicam  (MOBIC ) 7.5 MG tablet Take 1 tablet (7.5 mg total) by mouth daily. 06/30/24  Yes Remmy Crass A, PA-C  acetaminophen  (TYLENOL ) 500 MG tablet Take 2 tablets (1,000 mg total) by mouth every 6 (six) hours as needed for mild pain or moderate pain. Patient not taking: Reported on 03/08/2017 08/08/16   Claudene, Virginia , CNM  albuterol  (PROVENTIL  HFA) 108 (90 Base) MCG/ACT inhaler Inhale 1-2 puffs into the lungs every 6 (six) hours as needed for shortness of breath.     [provider]  benzocaine  (ORAJEL) 10 % mucosal gel Use as directed 1 Application in the mouth or throat as needed for mouth pain. 06/30/24   Zahara Rembert A, PA-C  clotrimazole  (LOTRIMIN ) 1 % cream Apply to affected area 2 times daily until  resolution. 03/05/19   Dreama Longs, MD  fluticasone-salmeterol (ADVAIR HFA) 230-21 MCG/ACT inhaler Inhale 2 puffs into the lungs daily as needed (SOB).     [provider]  Prenat w/o A Vit-FeFum-FePo-FA (CONCEPT OB ) 130-92.4-1 MG CAPS Take 1 tablet by mouth daily. 08/08/16   Smith, Virginia , CNM  ranitidine (ZANTAC) 150 MG tablet Take 150 mg by mouth 2 (two) times daily.    [provider]    Allergies: Amoxicillin and Penicillins    Review of Systems  Constitutional: Negative.   HENT:  Positive for dental problem.   Respiratory: Negative.    Cardiovascular: Negative.   All other systems reviewed and are negative.   Updated Vital Signs BP (!) 139/91 (BP Location: Left Arm)   Pulse 78   Temp 98.1 F (36.7 C) (Oral)   Resp 16   Ht 5' 2 (1.575 m)   Wt 77.1 kg   LMP 06/16/2024 (Exact Date)   SpO2 100%   BMI 31.09 kg/m   Physical Exam Vitals and nursing note reviewed.  Constitutional:      General: She is not in acute distress.    Appearance: She is well-developed. She is not ill-appearing, toxic-appearing or diaphoretic.  HENT:     Head: Normocephalic and atraumatic.     Comments: Submandibular area soft.  No facial swelling, redness, warmth.    Nose: Nose normal.     Mouth/Throat:  Mouth: Mucous membranes are moist.     Comments: Tongue midline.  No drooling, dysphagia or trismus.  Posterior pharynx clear.  Sublingual area soft.  Dentition cracked to gumline right lower molars.  Some mild gingival erythema without drainable abscess. Eyes:     Pupils: Pupils are equal, round, and reactive to light.  Neck:     Comments: No neck stiffness or neck rigidity, full range of motion Cardiovascular:     Rate and Rhythm: Normal rate.  Pulmonary:     Effort: No respiratory distress.  Abdominal:     General: There is no distension.  Musculoskeletal:        General: Normal range of motion.     Cervical back: Normal range of motion and neck supple.   Skin:    General: Skin is warm and dry.  Neurological:     General: No focal deficit present.     Mental Status: She is alert.  Psychiatric:        Mood and Affect: Mood normal.     (all labs ordered are listed, but only abnormal results are displayed) Labs Reviewed - No data to display  EKG: None  Radiology: No results found.   Procedures   Medications Ordered in the ED  clindamycin  (CLEOCIN ) capsule 300 mg (300 mg Oral Given 06/30/24 1714)  ketorolac  (TORADOL ) 30 MG/ML injection 30 mg (30 mg Intramuscular Given 06/30/24 4970)   33 year old here for evaluation of dental pain.  Has been seen before previously.  Was post to follow-up with dentistry however could not afford treatment.  Symptoms initially improved after ED visit a few weeks back with antibiotics however returned.  Here she has no facial swelling.  I have low suspicion for Ludwig's angina, deep space infection.  Her sublingual area soft, no angioedema.  She does have some cracked dentition to the gumline to her right lower molars.  Mild gingival erythema.  I do not feel she needs labs or imaging.  Will start anti-inflammatories, antibiotics.  Ultimately she needs follow-up with dentistry for complete resolution of this.  The patient has been appropriately medically screened and/or stabilized in the ED. I have low suspicion for any other emergent medical condition which would require further screening, evaluation or treatment in the ED or require inpatient management.  Patient is hemodynamically stable and in no acute distress.  Patient able to ambulate in department prior to ED.  Evaluation does not show acute pathology that would require ongoing or additional emergent interventions while in the emergency department or further inpatient treatment.  I have discussed the diagnosis with the patient and answered all questions.  Pain is been managed while in the emergency department and patient has no further complaints prior to  discharge.  Patient is comfortable with plan discussed in room and is stable for discharge at this time.  I have discussed strict return precautions for returning to the emergency department.  Patient was encouraged to follow-up with PCP/specialist refer to at discharge.                                    Medical Decision Making Amount and/or Complexity of Data Reviewed External Data Reviewed: labs, radiology and notes.  Risk OTC drugs. Prescription drug management. Diagnosis or treatment significantly limited by social determinants of health.       Final diagnoses:  Pain, dental    ED Discharge Orders  Ordered    meloxicam  (MOBIC ) 7.5 MG tablet  Daily        06/30/24 1654    chlorhexidine  (PERIDEX ) 0.12 % solution  2 times daily        06/30/24 1654    clindamycin  (CLEOCIN ) 300 MG capsule  3 times daily        06/30/24 1654    benzocaine  (ORAJEL) 10 % mucosal gel  As needed        06/30/24 1654               Bevin Das A, PA-C 06/30/24 2130    Elnor Savant A, DO 07/05/24 2349

## 2024-06-30 NOTE — Discharge Instructions (Signed)
 It was a pleasure taking care of you here today  Take the medications as prescribed  Make sure to follow-up outpatient, return for new or worsening symptoms

## 2024-06-30 NOTE — ED Triage Notes (Signed)
 Seen here 3 weeks ago for dental abscess, given abx. Completed course, went to have tooth pulled and had issue with insurance, was unable to get it pulled. Pain had improved after abx, but worse now, cracked tooth. States needs something for the pain, has to work [employer].

## 2024-06-30 NOTE — ED Notes (Signed)

## 2024-09-08 ENCOUNTER — Emergency Department (HOSPITAL_BASED_OUTPATIENT_CLINIC_OR_DEPARTMENT_OTHER)
Admission: EM | Admit: 2024-09-08 | Discharge: 2024-09-08 | Disposition: A | Discharge status: Home / Self Care | Payer: Self-pay | Attending: Emergency Medicine | Admitting: Emergency Medicine

## 2024-09-08 ENCOUNTER — Other Ambulatory Visit: Payer: Self-pay

## 2024-09-08 ENCOUNTER — Encounter (HOSPITAL_BASED_OUTPATIENT_CLINIC_OR_DEPARTMENT_OTHER): Payer: Self-pay

## 2024-09-08 DIAGNOSIS — R110 Nausea: Secondary | ICD-10-CM | POA: Insufficient documentation

## 2024-09-08 DIAGNOSIS — R197 Diarrhea, unspecified: Secondary | ICD-10-CM | POA: Insufficient documentation

## 2024-09-08 DIAGNOSIS — R109 Unspecified abdominal pain: Secondary | ICD-10-CM | POA: Insufficient documentation

## 2024-09-08 MED ORDER — DIPHENOXYLATE-ATROPINE 2.5-0.025 MG PO TABS: 1.0000 | ORAL_TABLET | Freq: Four times a day (QID) | ORAL | 0 refills | Status: AC | PRN

## 2024-09-08 MED ORDER — METOCLOPRAMIDE HCL 10 MG PO TABS: 10.0000 mg | ORAL_TABLET | Freq: Four times a day (QID) | ORAL | 0 refills | Status: AC

## 2024-09-08 MED ORDER — DICYCLOMINE HCL 20 MG PO TABS: 20.0000 mg | ORAL_TABLET | Freq: Two times a day (BID) | ORAL | 0 refills | Status: AC

## 2024-09-08 NOTE — ED Triage Notes (Signed)
 Arrives POV with complaints of developing diarrhea overnight. No fevers or vomiting.

## 2024-09-08 NOTE — Discharge Instructions (Signed)
 Contact a health care provider if: You have a fever. Your diarrhea gets worse. You have new symptoms. You vomit every time you eat or drink. You feel light-headed, dizzy, or have a headache. You have muscle cramps. You have signs of dehydration, such as: Dark urine, very little urine, or no urine. Cracked lips. Dry mouth. Sunken eyes. Sleepiness. Weakness. You have bloody or black stools or stools that look like tar. You have severe pain, cramping, or bloating in your abdomen. Your skin feels cold and clammy. You feel confused. Get help right away if: You have chest pain or your heart is beating very quickly. You have trouble breathing or you are breathing very quickly. You feel extremely weak or you faint. These symptoms may be an emergency. Get help right away. Call 911. Do not wait to see if the symptoms will go away. Do not drive yourself to the hospital. This information is not intended to replace advice given to you by your health care provider. Make sure you discuss any questions you have with your health care provider.

## 2024-09-08 NOTE — ED Provider Notes (Signed)
 [place] EM[facility] Provider Note   CSN: [id number] Arrival date & time: 09/08/24  9261     Patient presents with: Diarrhea   Lisa Bond is a 33 y.o. female who presents for diarrhea.  She had onset of diarrhea and nausea.  She took Imodium twice during her 12-hour shift overnight but was unable to stop having watery stools.  She has some mild abdominal cramping but no severe abdominal tenderness.  She has some rectal soreness due to persistent diarrhea.  No recent foreign travel or ingestion of suspicious foods.    Diarrhea      Prior to Admission medications  Medication Sig Start Date End Date Taking? Authorizing Provider  dicyclomine  (BENTYL ) 20 MG tablet Take 1 tablet (20 mg total) by mouth 2 (two) times daily. 09/08/24  Yes Orian Amberg, PA-C  diphenoxylate -atropine  (LOMOTIL ) 2.5-0.025 MG tablet Take 1 tablet by mouth 4 (four) times daily as needed for diarrhea or loose stools. 09/08/24  Yes Sergio Zawislak, PA-C  metoCLOPramide  (REGLAN ) 10 MG tablet Take 1 tablet (10 mg total) by mouth every 6 (six) hours. 09/08/24  Yes Everet Flagg, PA-C  acetaminophen  (TYLENOL ) 500 MG tablet Take 2 tablets (1,000 mg total) by mouth every 6 (six) hours as needed for mild pain or moderate pain. Patient not taking: Reported on 03/08/2017 08/08/16   Claudene, Virginia , CNM  albuterol  (PROVENTIL  HFA) 108 (90 Base) MCG/ACT inhaler Inhale 1-2 puffs into the lungs every 6 (six) hours as needed for shortness of breath.     [provider]  benzocaine  (ORAJEL) 10 % mucosal gel Use as directed 1 Application in the mouth or throat as needed for mouth pain. 06/30/24   Henderly, Britni A, PA-C  chlorhexidine  (PERIDEX ) 0.12 % solution Use as directed 15 mLs in the mouth or throat 2 (two) times daily. 06/30/24   Henderly, Britni A, PA-C  clotrimazole  (LOTRIMIN ) 1 % cream Apply to affected area 2 times daily until resolution. 03/05/19   Dreama Longs, MD   fluticasone-salmeterol (ADVAIR HFA) 230-21 MCG/ACT inhaler Inhale 2 puffs into the lungs daily as needed (SOB).     [provider]  meloxicam  (MOBIC ) 7.5 MG tablet Take 1 tablet (7.5 mg total) by mouth daily. 06/30/24   Henderly, Britni A, PA-C  Prenat w/o A Vit-FeFum-FePo-FA (CONCEPT OB ) 130-92.4-1 MG CAPS Take 1 tablet by mouth daily. 08/08/16   Smith, Virginia , CNM  ranitidine (ZANTAC) 150 MG tablet Take 150 mg by mouth 2 (two) times daily.    [provider]    Allergies: Amoxicillin and Penicillins    Review of Systems  Gastrointestinal:  Positive for diarrhea.    Updated Vital Signs BP (!) 140/76 (BP Location: Right Arm)   Pulse 86   Temp 97.6 F (36.4 C) (Oral)   Resp 14   Ht 5' 2 (1.575 m)   Wt 71.7 kg   SpO2 100%   BMI 28.90 kg/m   Physical Exam Vitals and nursing note reviewed.  Constitutional:      General: She is not in acute distress.    Appearance: She is well-developed. She is not diaphoretic.  HENT:     Head: Normocephalic and atraumatic.     Right Ear: External ear normal.     Left Ear: External ear normal.     Nose: Nose normal.     Mouth/Throat:     Mouth: Mucous membranes are moist.  Eyes:     General: No scleral icterus.  Conjunctiva/sclera: Conjunctivae normal.  Cardiovascular:     Rate and Rhythm: Normal rate and regular rhythm.     Heart sounds:     No friction rub. No gallop.  Pulmonary:     Effort: Pulmonary effort is normal. No respiratory distress.     Breath sounds: Normal breath sounds.  Abdominal:     General: Bowel sounds are normal. There is no distension.     Palpations: Abdomen is soft. There is no mass.     Tenderness: There is no abdominal tenderness. There is no guarding.  Musculoskeletal:     Cervical back: Normal range of motion.  Skin:    General: Skin is warm and dry.  Neurological:     Mental Status: She is alert and oriented to person, place, and time.  Psychiatric:        Behavior: Behavior  normal.     (all labs ordered are listed, but only abnormal results are displayed) Labs Reviewed - No data to display  EKG: None  Radiology: No results found.   Procedures   Medications Ordered in the ED - No data to display                                  Medical Decision Making Risk Prescription drug management.   Patient here with diarrhea. The differential diagnosis of diarrhea includes but is not limited to Viral- norovirus/rotavirus; Bacterial-Campylobacter,Shigella, Salmonella, Escherichia coli, E. coli 0157:H7, Yersinia enterocolitica, Vibrio cholerae, Clostridium difficile. Parasitic- Giardia lamblia, Cryptosporidium,Entamoeba histolytica,Cyclospora, Microsporidium. Toxin- Staphylococcus aureus, Bacillus cereus. Noninfectious causes include GI Bleed, Appendicitis, Mesenteric Ischemia, Diverticulitis, Adrenal Crisis, Thyroid Storm, Toxicologic exposures, Antibiotic or drug-associated, inflammatory bowel disease.    Benign abdominal exam.  Patient is just looking for relief of her symptoms.  She is well-appearing with normal vital signs.   No active vomiting here in the emergency department.  It is slowed to some degree but she continues to have watery diarrhea.  Patient will be discharged with Lomotil , Bentyl , Reglan  for vomiting.  PDMP reviewed during this encounter.      Final diagnoses:  Diarrhea, unspecified type    ED Discharge Orders          Ordered    diphenoxylate -atropine  (LOMOTIL ) 2.5-0.025 MG tablet  4 times daily PRN        09/08/24 1049    dicyclomine  (BENTYL ) 20 MG tablet  2 times daily        09/08/24 1049    metoCLOPramide  (REGLAN ) 10 MG tablet  Every 6 hours        09/08/24 1049               Arloa Chroman, PA-C 09/08/24 1127    Towana Ozell BROCKS, MD 09/08/24 1712

## 2024-09-22 ENCOUNTER — Emergency Department (HOSPITAL_COMMUNITY)
Admission: EM | Admit: 2024-09-22 | Discharge: 2024-09-22 | Disposition: A | Discharge status: Home / Self Care | Payer: Self-pay | Attending: Emergency Medicine | Admitting: Emergency Medicine

## 2024-09-22 DIAGNOSIS — B349 Viral infection, unspecified: Secondary | ICD-10-CM | POA: Insufficient documentation

## 2024-09-22 LAB — RESP PANEL BY RT-PCR (RSV, FLU A&B, COVID)  RVPGX2
Influenza A by PCR: NEGATIVE
Influenza B by PCR: NEGATIVE
Resp Syncytial Virus by PCR: NEGATIVE
SARS Coronavirus 2 by RT PCR: NEGATIVE

## 2024-09-22 NOTE — ED Provider Notes (Signed)
 " [place] EMERGENCY DEPARTMENT AT [facility] Provider Note   CSN: [id number] Arrival date & time: 09/22/24  9264     History Chief Complaint  Patient presents with   Influenza    HPI: Lisa Bond is a 33 y.o. female with no pertinent history who presents complaining of multiple symptoms. Patient arrived via POV.  History provided by patient.  No interpreter required during this encounter.  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  Reports that multiple residents at the nursing home have recently been diagnosed with influenza, and she also has a young child at home who has had URI symptoms.  Patient reports that she has had 3 days of cough, congestion, rhinorrhea.  Reports that she may have had chills, though she is unsure if this is just the labile temperature at the nursing facility.  Denies fever, chest pain, shortness of breath, nausea, vomiting.  Does report that she has had some loose stools.  Reports that she came to get a flu test because she wanted to see whether or not she had this, particularly given her child has autism spectrum disorder.  Patient's recorded medical, surgical, social, medication list and allergies were reviewed in the Snapshot window as part of the initial history.   Prior to Admission medications  Medication Sig Start Date End Date Taking? Authorizing Provider  acetaminophen  (TYLENOL ) 500 MG tablet Take 2 tablets (1,000 mg total) by mouth every 6 (six) hours as needed for mild pain or moderate pain. Patient not taking: Reported on 03/08/2017 08/08/16   Claudene, [place] , CNM  albuterol  (PROVENTIL  HFA) 108 (90 Base) MCG/ACT inhaler Inhale 1-2 puffs into the lungs every 6 (six) hours as needed for shortness of breath.     [provider]  benzocaine  (ORAJEL) 10 % mucosal gel Use as directed 1 Application in the mouth or throat as needed for mouth pain. 06/30/24   Henderly, Britni A, PA-C  chlorhexidine   (PERIDEX ) 0.12 % solution Use as directed 15 mLs in the mouth or throat 2 (two) times daily. 06/30/24   Henderly, Britni A, PA-C  clotrimazole  (LOTRIMIN ) 1 % cream Apply to affected area 2 times daily until resolution. 03/05/19   Dreama Longs, MD  dicyclomine  (BENTYL ) 20 MG tablet Take 1 tablet (20 mg total) by mouth 2 (two) times daily. 09/08/24   Harris, Abigail, PA-C  diphenoxylate -atropine  (LOMOTIL ) 2.5-0.025 MG tablet Take 1 tablet by mouth 4 (four) times daily as needed for diarrhea or loose stools. 09/08/24   Harris, Abigail, PA-C  fluticasone-salmeterol (ADVAIR HFA) 230-21 MCG/ACT inhaler Inhale 2 puffs into the lungs daily as needed (SOB).     [provider]  meloxicam  (MOBIC ) 7.5 MG tablet Take 1 tablet (7.5 mg total) by mouth daily. 06/30/24   Henderly, Britni A, PA-C  metoCLOPramide  (REGLAN ) 10 MG tablet Take 1 tablet (10 mg total) by mouth every 6 (six) hours. 09/08/24   Harris, Abigail, PA-C  Prenat w/o A Vit-FeFum-FePo-FA (CONCEPT OB ) 130-92.4-1 MG CAPS Take 1 tablet by mouth daily. 08/08/16   Smith, Virginia , CNM  ranitidine (ZANTAC) 150 MG tablet Take 150 mg by mouth 2 (two) times daily.    [provider]     Allergies: Amoxicillin and Penicillins   Review of Systems   ROS as per HPI  Physical Exam Updated Vital Signs BP (!) 144/84 (BP Location: Left Arm)   Pulse 78   Temp 98.2 F (36.8 C) (Oral)   Resp  16   Ht 5' 2 (1.575 m)   Wt 71.7 kg   SpO2 100%   BMI 28.90 kg/m  Physical Exam Vitals and nursing note reviewed.  Constitutional:      General: She is not in acute distress.    Appearance: She is well-developed.  HENT:     Head: Normocephalic and atraumatic.  Eyes:     Conjunctiva/sclera: Conjunctivae normal.  Cardiovascular:     Rate and Rhythm: Normal rate and regular rhythm.     Heart sounds: No murmur heard. Pulmonary:     Effort: Pulmonary effort is normal. No respiratory distress.     Breath sounds: Normal breath sounds.   Abdominal:     Palpations: Abdomen is soft.     Tenderness: There is no abdominal tenderness.  Musculoskeletal:        General: No swelling.     Cervical back: Neck supple.  Skin:    General: Skin is warm and dry.     Capillary Refill: Capillary refill takes less than 2 seconds.  Neurological:     Mental Status: She is alert.  Psychiatric:        Mood and Affect: Mood normal.     ED Course/ Medical Decision Making/ A&P    Procedures Procedures   Medications Ordered in ED Medications - No data to display  Medical Decision Making:   [name] is a 33 y.o. female who presents for URI symptoms as per above.  Physical exam is pertinent for no focal abnormalities.   The differential includes but is not limited to COVID, flu, RSV, other viral illness, pneumonia, sepsis.  Independent historian: None  External data reviewed: No pertinent external data  Labs: Ordered, Independent interpretation, and Details: COVID/flu/RSV negative  Radiology: Not indicated No results found.  EKG/Medicine tests: Not indicated EKG Interpretation:                  Interventions: None  See the EMR for full details regarding lab and imaging results.  Patient presents for URI symptoms and wanting to be tested with the flu.  Patient underwent RVP which was negative for COVID, flu, RSV.  Patient otherwise with nonproductive cough, no focal pulmonary changes on lung auscultation, vitally stable, therefore doubt pneumonia, sepsis.  Discussed that she likely has a non-COVID/flu/RSV viral etiology, discussed relative rest, use of Tylenol  and ibuprofen , fluids, patient expresses understanding, feels comfortable following up with PCP.  Presentation is most consistent with acute uncomplicated illness with systemic symptoms  Discussion of management or test interpretations with external provider(s): Not indicated  Risk Drugs:None  Disposition: DISCHARGE: I believe that the patient is safe for  discharge home with outpatient follow-up. Patient was informed of all pertinent physical exam, laboratory, and imaging findings.  Patient's suspected etiology of their symptom presentation was discussed with the patient and all questions were answered. We discussed following up with PCP. I provided thorough ED return precautions. The patient feels safe and comfortable with this plan.  MDM generated using voice dictation software and may contain dictation errors.  Please contact me for any clarification or with any questions.  Clinical Impression:  1. Viral illness      Discharge   Final Clinical Impression(s) / ED Diagnoses Final diagnoses:  Viral illness    Rx / DC Orders ED Discharge Orders     None        Rogelia Jerilynn RAMAN, MD 09/22/24 [phone]  "

## 2024-09-22 NOTE — ED Triage Notes (Signed)
 Patient reports she works at [employer] and 17 people have flu Patient developed cough, runny nose 3 days ago Denies shobr, chest pain Patient wants flu test

## 2024-09-22 NOTE — Discharge Instructions (Signed)
 Venera M Dower  Thank you for allowing us  to take care of you today.  You came to the Emergency Department today because you are having multiple symptoms of having a viral illness.  Here in the emergency department you are negative for COVID, flu, RSV.  You likely have a different virus, they can all cause similar symptoms.  We recommend rest, hydration, and Tylenol  and ibuprofen  as needed for pain or fever.  To-Do: 1. Please follow-up with your primary doctor within 1 month / as soon as possible.   Please return to the Emergency Department or call 911 if you experience have worsening of your symptoms, or do not get better, chest pain, shortness of breath, severe or significantly worsening pain, high fever, severe confusion, pass out or have any reason to think that you need emergency medical care.   We hope you feel better soon.   Mitzie Later, MD Department of Emergency Medicine [facility] [place]

## 2024-09-26 ENCOUNTER — Other Ambulatory Visit: Payer: Self-pay

## 2024-09-26 ENCOUNTER — Emergency Department (HOSPITAL_BASED_OUTPATIENT_CLINIC_OR_DEPARTMENT_OTHER)
Admission: EM | Admit: 2024-09-26 | Discharge: 2024-09-26 | Disposition: A | Discharge status: Home / Self Care | Payer: Self-pay | Attending: Emergency Medicine | Admitting: Emergency Medicine

## 2024-09-26 ENCOUNTER — Encounter (HOSPITAL_BASED_OUTPATIENT_CLINIC_OR_DEPARTMENT_OTHER): Payer: Self-pay

## 2024-09-26 DIAGNOSIS — R1110 Vomiting, unspecified: Secondary | ICD-10-CM | POA: Insufficient documentation

## 2024-09-26 DIAGNOSIS — R197 Diarrhea, unspecified: Secondary | ICD-10-CM | POA: Insufficient documentation

## 2024-09-26 LAB — LIPASE, BLOOD: Lipase: 22 U/L (ref 11–51)

## 2024-09-26 LAB — COMPREHENSIVE METABOLIC PANEL WITH GFR
ALT: 22 U/L (ref 0–44)
AST: 23 U/L (ref 15–41)
Albumin: 4.5 g/dL (ref 3.5–5.0)
Alkaline Phosphatase: 74 U/L (ref 38–126)
Anion gap: 9 (ref 5–15)
BUN: 17 mg/dL (ref 6–20)
CO2: 23 mmol/L (ref 22–32)
Calcium: 9.2 mg/dL (ref 8.9–10.3)
Chloride: 104 mmol/L (ref 98–111)
Creatinine, Ser: 0.92 mg/dL (ref 0.44–1.00)
GFR, Estimated: >60 mL/min
Glucose, Bld: 88 mg/dL (ref 70–99)
Potassium: 4.4 mmol/L (ref 3.5–5.1)
Sodium: 135 mmol/L (ref 135–145)
Total Bilirubin: 0.3 mg/dL (ref 0.0–1.2)
Total Protein: 8 g/dL (ref 6.5–8.1)

## 2024-09-26 LAB — URINALYSIS, ROUTINE W REFLEX MICROSCOPIC
Bilirubin Urine: NEGATIVE
Glucose, UA: NEGATIVE mg/dL
Ketones, ur: NEGATIVE mg/dL
Nitrite: NEGATIVE
Protein, ur: NEGATIVE mg/dL
Specific Gravity, Urine: >=1.03 (ref 1.005–1.030)
pH: 5.5 (ref 5.0–8.0)

## 2024-09-26 LAB — CBC
HCT: 46.5 % — ABNORMAL HIGH (ref 36.0–46.0)
Hemoglobin: 15.6 g/dL — ABNORMAL HIGH (ref 12.0–15.0)
MCH: 30.2 pg (ref 26.0–34.0)
MCHC: 33.5 g/dL (ref 30.0–36.0)
MCV: 90.1 fL (ref 80.0–100.0)
Platelets: 214 K/uL (ref 150–400)
RBC: 5.16 MIL/uL — ABNORMAL HIGH (ref 3.87–5.11)
RDW: 13 % (ref 11.5–15.5)
WBC: 5.4 K/uL (ref 4.0–10.5)
nRBC: 0 % (ref 0.0–0.2)

## 2024-09-26 LAB — PREGNANCY, URINE: Preg Test, Ur: NEGATIVE

## 2024-09-26 LAB — URINALYSIS, MICROSCOPIC (REFLEX)

## 2024-09-26 MED ORDER — ONDANSETRON HCL 4 MG PO TABS: 4.0000 mg | ORAL_TABLET | Freq: Four times a day (QID) | ORAL | 0 refills | Status: AC

## 2024-09-26 MED ORDER — ONDANSETRON HCL 4 MG/2ML IJ SOLN: 4.0000 mg | Freq: Once | INTRAMUSCULAR | Status: DC

## 2024-09-26 MED ORDER — SODIUM CHLORIDE 0.9 % IV BOLUS: 1000.0000 mL | Freq: Once | INTRAVENOUS | Status: DC

## 2024-09-26 MED ORDER — LOPERAMIDE HCL 2 MG PO CAPS: 2.0000 mg | ORAL_CAPSULE | Freq: Four times a day (QID) | ORAL | 0 refills | Status: AC | PRN

## 2024-09-26 NOTE — ED Provider Notes (Signed)
 " [place] EMERGENCY DEPARTMENT AT [facility] Provider Note   CSN: [id number] Arrival date & time: 09/26/24  1157     Patient presents with: Diarrhea   Lisa Bond is a 33 y.o. female.   33 year old female here today for diarrhea and vomiting.  Symptoms started yesterday but have improved.  Patient works in a garment/textile technologist facility, believes that this is likely causing her symptoms.  She has had previous episodes this month that have resolved and improved.   Diarrhea      Prior to Admission medications  Medication Sig Start Date End Date Taking? Authorizing Provider  loperamide (IMODIUM) 2 MG capsule Take 1 capsule (2 mg total) by mouth 4 (four) times daily as needed for diarrhea or loose stools. 09/26/24  Yes Mannie Pac T, DO  ondansetron  (ZOFRAN ) 4 MG tablet Take 1 tablet (4 mg total) by mouth every 6 (six) hours. 09/26/24  Yes Mannie Pac T, DO  acetaminophen  (TYLENOL ) 500 MG tablet Take 2 tablets (1,000 mg total) by mouth every 6 (six) hours as needed for mild pain or moderate pain. Patient not taking: Reported on 03/08/2017 08/08/16   Claudene, Virginia , CNM  albuterol  (PROVENTIL  HFA) 108 (90 Base) MCG/ACT inhaler Inhale 1-2 puffs into the lungs every 6 (six) hours as needed for shortness of breath.     [provider]  benzocaine  (ORAJEL) 10 % mucosal gel Use as directed 1 Application in the mouth or throat as needed for mouth pain. 06/30/24   Henderly, Britni A, PA-C  chlorhexidine  (PERIDEX ) 0.12 % solution Use as directed 15 mLs in the mouth or throat 2 (two) times daily. 06/30/24   Henderly, Britni A, PA-C  clotrimazole  (LOTRIMIN ) 1 % cream Apply to affected area 2 times daily until resolution. 03/05/19   Dreama Longs, MD  dicyclomine  (BENTYL ) 20 MG tablet Take 1 tablet (20 mg total) by mouth 2 (two) times daily. 09/08/24   Harris, Abigail, PA-C  diphenoxylate -atropine  (LOMOTIL ) 2.5-0.025 MG tablet Take 1 tablet by mouth 4 (four) times daily  as needed for diarrhea or loose stools. 09/08/24   Harris, Abigail, PA-C  fluticasone-salmeterol (ADVAIR HFA) 230-21 MCG/ACT inhaler Inhale 2 puffs into the lungs daily as needed (SOB).     [provider]  meloxicam  (MOBIC ) 7.5 MG tablet Take 1 tablet (7.5 mg total) by mouth daily. 06/30/24   Henderly, Britni A, PA-C  metoCLOPramide  (REGLAN ) 10 MG tablet Take 1 tablet (10 mg total) by mouth every 6 (six) hours. 09/08/24   Harris, Abigail, PA-C  Prenat w/o A Vit-FeFum-FePo-FA (CONCEPT OB ) 130-92.4-1 MG CAPS Take 1 tablet by mouth daily. 08/08/16   Smith, Virginia , CNM  ranitidine (ZANTAC) 150 MG tablet Take 150 mg by mouth 2 (two) times daily.    [provider]    Allergies: Amoxicillin and Penicillins    Review of Systems  Gastrointestinal:  Positive for diarrhea.    Updated Vital Signs BP 121/84 (BP Location: Left Arm)   Pulse 68   Temp 97.8 F (36.6 C) (Oral)   Resp 18   Ht 5' 2 (1.575 m)   Wt 71.7 kg   LMP 09/17/2024 (Approximate)   SpO2 97%   BMI 28.90 kg/m   Physical Exam Vitals and nursing note reviewed.  Constitutional:      Appearance: She is not ill-appearing.  HENT:     Head: Normocephalic.  Pulmonary:     Effort: Pulmonary effort is normal.  Abdominal:     General: Abdomen is  flat. There is no distension.     Palpations: Abdomen is soft.     Tenderness: There is no abdominal tenderness.  Musculoskeletal:        General: Normal range of motion.     Cervical back: Normal range of motion.  Skin:    Coloration: Skin is not jaundiced.  Neurological:     General: No focal deficit present.     Mental Status: She is alert.     (all labs ordered are listed, but only abnormal results are displayed) Labs Reviewed  CBC - Abnormal; Notable for the following components:      Result Value   RBC 5.16 (*)    Hemoglobin 15.6 (*)    HCT 46.5 (*)    All other components within normal limits  URINALYSIS, ROUTINE W REFLEX MICROSCOPIC - Abnormal;  Notable for the following components:   Hgb urine dipstick TRACE (*)    Leukocytes,Ua SMALL (*)    All other components within normal limits  URINALYSIS, MICROSCOPIC (REFLEX) - Abnormal; Notable for the following components:   Bacteria, UA FEW (*)    All other components within normal limits  LIPASE, BLOOD  COMPREHENSIVE METABOLIC PANEL WITH GFR  PREGNANCY, URINE    EKG: None  Radiology: No results found.   Procedures   Medications Ordered in the ED - No data to display                                  Medical Decision Making 33 year old female here today for vomiting diarrhea.  Differential diagnoses include gastroenteritis, intra-abdominal infection, less likely obstruction, less likely appendicitis, less likely ectopic, less likely UTI.  Plan- on exam patient overall looks well.  Her abdomen soft and nondistended.  She had blood work done at triage which do show some hemoconcentration.  She is tolerating p.o.  Primarily she is requesting some Imodium for intermittent diarrhea as well as a note for work.  I considered imaging the patient and providing IV fluids, however with her well appearance and in no obvious indicated.  Will discharge patient.  Amount and/or Complexity of Data Reviewed Labs: ordered.        Final diagnoses:  Diarrhea of presumed infectious origin    ED Discharge Orders          Ordered    loperamide (IMODIUM) 2 MG capsule  4 times daily PRN        09/26/24 1536    ondansetron  (ZOFRAN ) 4 MG tablet  Every 6 hours        09/26/24 1536               Mannie Pac T, DO 09/26/24 1537  "

## 2024-09-26 NOTE — Discharge Instructions (Signed)
 I have sent you prescriptions for Zofran  and Imodium.  Zofran  is a nausea medicine that he may use up to 3 times per day.  Imodium can also help with diarrhea.  You may take 2 to 4 mg 3 times per day.  Follow-up with your primary care doctor.

## 2024-09-26 NOTE — ED Triage Notes (Signed)
 States been having vomiting and diarrhea x 3 days. Had similar symptoms a week ago, seen at [facility]. Symptoms resolved then returned.
